# Patient Record
Sex: Female | Born: 1980 | Race: White | Hispanic: No | Marital: Married | Smoking: Never smoker
Health system: Southern US, Community
[De-identification: ages and names within clinical notes are randomized; demographics above are authoritative.]

## PROBLEM LIST (undated history)

## (undated) DIAGNOSIS — Z8669 Personal history of other diseases of the nervous system and sense organs: Secondary | ICD-10-CM

## (undated) DIAGNOSIS — Z9889 Other specified postprocedural states: Secondary | ICD-10-CM

## (undated) DIAGNOSIS — R112 Nausea with vomiting, unspecified: Secondary | ICD-10-CM

## (undated) DIAGNOSIS — Z87442 Personal history of urinary calculi: Secondary | ICD-10-CM

## (undated) DIAGNOSIS — Z8709 Personal history of other diseases of the respiratory system: Secondary | ICD-10-CM

## (undated) DIAGNOSIS — I10 Essential (primary) hypertension: Secondary | ICD-10-CM

## (undated) DIAGNOSIS — E785 Hyperlipidemia, unspecified: Secondary | ICD-10-CM

## (undated) DIAGNOSIS — R011 Cardiac murmur, unspecified: Secondary | ICD-10-CM

## (undated) HISTORY — PX: TUBAL LIGATION: SHX77

## (undated) HISTORY — PX: CHOLECYSTECTOMY: SHX55

## (undated) HISTORY — PX: KNEE ARTHROSCOPY: SUR90

## (undated) HISTORY — DX: Cardiac murmur, unspecified: R01.1

## (undated) HISTORY — PX: BACK SURGERY: SHX140

## (undated) HISTORY — PX: TONSILLECTOMY: SUR1361

## (undated) HISTORY — PX: ECTOPIC PREGNANCY SURGERY: SHX613

## (undated) HISTORY — DX: Hyperlipidemia, unspecified: E78.5

---
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
## 2000-02-05 ENCOUNTER — Other Ambulatory Visit: Admission: RE | Admit: 2000-02-05 | Discharge: 2000-02-05 | Payer: Self-pay | Admitting: Obstetrics and Gynecology

## 2000-02-16 ENCOUNTER — Emergency Department (HOSPITAL_COMMUNITY): Admission: EM | Admit: 2000-02-16 | Discharge: 2000-02-16 | Payer: Self-pay | Admitting: Emergency Medicine

## 2000-04-10 ENCOUNTER — Emergency Department (HOSPITAL_COMMUNITY): Admission: EM | Admit: 2000-04-10 | Discharge: 2000-04-10 | Payer: Self-pay | Admitting: Emergency Medicine

## 2000-05-24 ENCOUNTER — Inpatient Hospital Stay (HOSPITAL_COMMUNITY): Admission: AD | Admit: 2000-05-24 | Discharge: 2000-05-24 | Payer: Self-pay | Admitting: Obstetrics & Gynecology

## 2000-06-08 ENCOUNTER — Inpatient Hospital Stay (HOSPITAL_COMMUNITY): Admission: AD | Admit: 2000-06-08 | Discharge: 2000-06-08 | Payer: Self-pay | Admitting: Obstetrics and Gynecology

## 2000-07-25 ENCOUNTER — Inpatient Hospital Stay (HOSPITAL_COMMUNITY): Admission: AD | Admit: 2000-07-25 | Discharge: 2000-07-25 | Payer: Self-pay | Admitting: *Deleted

## 2000-08-02 ENCOUNTER — Inpatient Hospital Stay (HOSPITAL_COMMUNITY): Admission: AD | Admit: 2000-08-02 | Discharge: 2000-08-02 | Payer: Self-pay | Admitting: Obstetrics and Gynecology

## 2000-08-06 ENCOUNTER — Inpatient Hospital Stay (HOSPITAL_COMMUNITY): Admission: AD | Admit: 2000-08-06 | Discharge: 2000-08-06 | Payer: Self-pay | Admitting: Obstetrics and Gynecology

## 2000-08-13 ENCOUNTER — Inpatient Hospital Stay (HOSPITAL_COMMUNITY): Admission: AD | Admit: 2000-08-13 | Discharge: 2000-08-16 | Payer: Self-pay | Admitting: Obstetrics and Gynecology

## 2000-08-17 ENCOUNTER — Encounter: Admission: RE | Admit: 2000-08-17 | Discharge: 2000-09-16 | Payer: Self-pay | Admitting: Obstetrics and Gynecology

## 2000-08-17 ENCOUNTER — Inpatient Hospital Stay (HOSPITAL_COMMUNITY): Admission: AD | Admit: 2000-08-17 | Discharge: 2000-08-17 | Payer: Self-pay | Admitting: Obstetrics and Gynecology

## 2000-09-03 ENCOUNTER — Emergency Department (HOSPITAL_COMMUNITY): Admission: EM | Admit: 2000-09-03 | Discharge: 2000-09-03 | Payer: Self-pay | Admitting: Emergency Medicine

## 2000-09-03 ENCOUNTER — Encounter: Payer: Self-pay | Admitting: Emergency Medicine

## 2000-10-04 ENCOUNTER — Other Ambulatory Visit: Admission: RE | Admit: 2000-10-04 | Discharge: 2000-10-04 | Payer: Self-pay | Admitting: Obstetrics and Gynecology

## 2000-10-17 ENCOUNTER — Encounter: Admission: RE | Admit: 2000-10-17 | Discharge: 2000-11-16 | Payer: Self-pay | Admitting: Obstetrics and Gynecology

## 2000-10-18 ENCOUNTER — Emergency Department (HOSPITAL_COMMUNITY): Admission: EM | Admit: 2000-10-18 | Discharge: 2000-10-18 | Payer: Self-pay | Admitting: *Deleted

## 2000-11-17 ENCOUNTER — Encounter: Admission: RE | Admit: 2000-11-17 | Discharge: 2000-12-17 | Payer: Self-pay | Admitting: Obstetrics and Gynecology

## 2000-12-12 ENCOUNTER — Inpatient Hospital Stay (HOSPITAL_COMMUNITY): Admission: AD | Admit: 2000-12-12 | Discharge: 2000-12-12 | Payer: Self-pay | Admitting: *Deleted

## 2000-12-17 ENCOUNTER — Encounter (INDEPENDENT_AMBULATORY_CARE_PROVIDER_SITE_OTHER): Payer: Self-pay

## 2000-12-17 ENCOUNTER — Ambulatory Visit (HOSPITAL_COMMUNITY): Admission: RE | Admit: 2000-12-17 | Discharge: 2000-12-17 | Payer: Self-pay | Admitting: Obstetrics and Gynecology

## 2001-01-17 ENCOUNTER — Encounter: Admission: RE | Admit: 2001-01-17 | Discharge: 2001-02-16 | Payer: Self-pay | Admitting: Obstetrics and Gynecology

## 2001-03-19 ENCOUNTER — Encounter: Admission: RE | Admit: 2001-03-19 | Discharge: 2001-04-18 | Payer: Self-pay | Admitting: Obstetrics and Gynecology

## 2001-04-19 ENCOUNTER — Encounter: Admission: RE | Admit: 2001-04-19 | Discharge: 2001-05-19 | Payer: Self-pay | Admitting: Obstetrics and Gynecology

## 2001-06-17 ENCOUNTER — Encounter: Admission: RE | Admit: 2001-06-17 | Discharge: 2001-07-17 | Payer: Self-pay | Admitting: Obstetrics and Gynecology

## 2001-06-28 ENCOUNTER — Inpatient Hospital Stay (HOSPITAL_COMMUNITY): Admission: AD | Admit: 2001-06-28 | Discharge: 2001-06-28 | Payer: Self-pay | Admitting: *Deleted

## 2001-07-10 ENCOUNTER — Inpatient Hospital Stay (HOSPITAL_COMMUNITY): Admission: AD | Admit: 2001-07-10 | Discharge: 2001-07-10 | Payer: Self-pay | Admitting: Obstetrics and Gynecology

## 2001-07-26 ENCOUNTER — Inpatient Hospital Stay (HOSPITAL_COMMUNITY): Admission: AD | Admit: 2001-07-26 | Discharge: 2001-07-26 | Payer: Self-pay | Admitting: Obstetrics and Gynecology

## 2001-08-11 ENCOUNTER — Other Ambulatory Visit: Admission: RE | Admit: 2001-08-11 | Discharge: 2001-08-11 | Payer: Self-pay | Admitting: Obstetrics and Gynecology

## 2001-08-17 ENCOUNTER — Encounter: Admission: RE | Admit: 2001-08-17 | Discharge: 2001-09-16 | Payer: Self-pay | Admitting: Obstetrics and Gynecology

## 2001-09-14 ENCOUNTER — Inpatient Hospital Stay (HOSPITAL_COMMUNITY): Admission: AD | Admit: 2001-09-14 | Discharge: 2001-09-16 | Payer: Self-pay | Admitting: Obstetrics and Gynecology

## 2001-09-15 ENCOUNTER — Encounter: Payer: Self-pay | Admitting: Obstetrics and Gynecology

## 2001-10-17 ENCOUNTER — Encounter: Admission: RE | Admit: 2001-10-17 | Discharge: 2001-11-16 | Payer: Self-pay | Admitting: Obstetrics and Gynecology

## 2001-11-07 ENCOUNTER — Inpatient Hospital Stay (HOSPITAL_COMMUNITY): Admission: AD | Admit: 2001-11-07 | Discharge: 2001-11-07 | Payer: Self-pay | Admitting: Obstetrics and Gynecology

## 2001-11-17 ENCOUNTER — Encounter: Admission: RE | Admit: 2001-11-17 | Discharge: 2001-12-17 | Payer: Self-pay | Admitting: Obstetrics and Gynecology

## 2001-12-14 ENCOUNTER — Emergency Department (HOSPITAL_COMMUNITY): Admission: EM | Admit: 2001-12-14 | Discharge: 2001-12-14 | Payer: Self-pay | Admitting: *Deleted

## 2002-02-08 ENCOUNTER — Inpatient Hospital Stay (HOSPITAL_COMMUNITY): Admission: AD | Admit: 2002-02-08 | Discharge: 2002-02-08 | Payer: Self-pay | Admitting: Obstetrics and Gynecology

## 2002-02-09 ENCOUNTER — Inpatient Hospital Stay (HOSPITAL_COMMUNITY): Admission: AD | Admit: 2002-02-09 | Discharge: 2002-02-13 | Payer: Self-pay | Admitting: Obstetrics and Gynecology

## 2002-02-09 ENCOUNTER — Encounter (INDEPENDENT_AMBULATORY_CARE_PROVIDER_SITE_OTHER): Payer: Self-pay

## 2002-04-16 ENCOUNTER — Emergency Department (HOSPITAL_COMMUNITY): Admission: EM | Admit: 2002-04-16 | Discharge: 2002-04-16 | Payer: Self-pay | Admitting: Emergency Medicine

## 2002-04-18 ENCOUNTER — Other Ambulatory Visit: Admission: RE | Admit: 2002-04-18 | Discharge: 2002-04-18 | Payer: Self-pay | Admitting: Obstetrics and Gynecology

## 2002-05-11 ENCOUNTER — Emergency Department (HOSPITAL_COMMUNITY): Admission: EM | Admit: 2002-05-11 | Discharge: 2002-05-11 | Payer: Self-pay | Admitting: Emergency Medicine

## 2002-10-30 ENCOUNTER — Emergency Department (HOSPITAL_COMMUNITY): Admission: EM | Admit: 2002-10-30 | Discharge: 2002-10-31 | Payer: Self-pay | Admitting: Emergency Medicine

## 2002-11-17 ENCOUNTER — Encounter: Payer: Self-pay | Admitting: Emergency Medicine

## 2002-11-17 ENCOUNTER — Emergency Department (HOSPITAL_COMMUNITY): Admission: EM | Admit: 2002-11-17 | Discharge: 2002-11-17 | Payer: Self-pay | Admitting: Emergency Medicine

## 2003-01-11 ENCOUNTER — Emergency Department (HOSPITAL_COMMUNITY): Admission: EM | Admit: 2003-01-11 | Discharge: 2003-01-11 | Payer: Self-pay | Admitting: Emergency Medicine

## 2003-01-14 ENCOUNTER — Emergency Department (HOSPITAL_COMMUNITY): Admission: EM | Admit: 2003-01-14 | Discharge: 2003-01-14 | Payer: Self-pay | Admitting: Emergency Medicine

## 2003-04-04 ENCOUNTER — Inpatient Hospital Stay (HOSPITAL_COMMUNITY): Admission: AD | Admit: 2003-04-04 | Discharge: 2003-04-04 | Payer: Self-pay | Admitting: Obstetrics and Gynecology

## 2003-05-14 ENCOUNTER — Other Ambulatory Visit: Admission: RE | Admit: 2003-05-14 | Discharge: 2003-05-14 | Payer: Self-pay | Admitting: Obstetrics and Gynecology

## 2003-05-16 ENCOUNTER — Observation Stay (HOSPITAL_COMMUNITY): Admission: AD | Admit: 2003-05-16 | Discharge: 2003-05-17 | Payer: Self-pay | Admitting: Obstetrics and Gynecology

## 2003-08-25 ENCOUNTER — Inpatient Hospital Stay (HOSPITAL_COMMUNITY): Admission: AD | Admit: 2003-08-25 | Discharge: 2003-08-25 | Payer: Self-pay | Admitting: Obstetrics and Gynecology

## 2003-08-30 ENCOUNTER — Inpatient Hospital Stay (HOSPITAL_COMMUNITY): Admission: AD | Admit: 2003-08-30 | Discharge: 2003-08-30 | Payer: Self-pay | Admitting: Obstetrics and Gynecology

## 2003-08-31 ENCOUNTER — Inpatient Hospital Stay (HOSPITAL_COMMUNITY): Admission: AD | Admit: 2003-08-31 | Discharge: 2003-09-02 | Payer: Self-pay | Admitting: Obstetrics and Gynecology

## 2003-09-14 ENCOUNTER — Inpatient Hospital Stay (HOSPITAL_COMMUNITY): Admission: AD | Admit: 2003-09-14 | Discharge: 2003-09-14 | Payer: Self-pay | Admitting: Obstetrics and Gynecology

## 2003-09-15 ENCOUNTER — Inpatient Hospital Stay (HOSPITAL_COMMUNITY): Admission: AD | Admit: 2003-09-15 | Discharge: 2003-09-15 | Payer: Self-pay | Admitting: Obstetrics and Gynecology

## 2003-09-20 ENCOUNTER — Other Ambulatory Visit: Admission: RE | Admit: 2003-09-20 | Discharge: 2003-09-20 | Payer: Self-pay | Admitting: Obstetrics and Gynecology

## 2003-11-08 ENCOUNTER — Ambulatory Visit: Admission: RE | Admit: 2003-11-08 | Discharge: 2003-11-08 | Payer: Self-pay | Admitting: Obstetrics and Gynecology

## 2003-11-09 ENCOUNTER — Emergency Department (HOSPITAL_COMMUNITY): Admission: EM | Admit: 2003-11-09 | Discharge: 2003-11-10 | Payer: Self-pay | Admitting: Emergency Medicine

## 2003-11-15 ENCOUNTER — Inpatient Hospital Stay (HOSPITAL_COMMUNITY): Admission: RE | Admit: 2003-11-15 | Discharge: 2003-11-18 | Payer: Self-pay | Admitting: Obstetrics and Gynecology

## 2003-11-15 ENCOUNTER — Encounter (INDEPENDENT_AMBULATORY_CARE_PROVIDER_SITE_OTHER): Payer: Self-pay | Admitting: Specialist

## 2003-11-23 ENCOUNTER — Emergency Department (HOSPITAL_COMMUNITY): Admission: EM | Admit: 2003-11-23 | Discharge: 2003-11-23 | Payer: Self-pay | Admitting: Emergency Medicine

## 2003-12-09 ENCOUNTER — Emergency Department (HOSPITAL_COMMUNITY): Admission: EM | Admit: 2003-12-09 | Discharge: 2003-12-10 | Payer: Self-pay | Admitting: *Deleted

## 2004-01-18 ENCOUNTER — Other Ambulatory Visit: Admission: RE | Admit: 2004-01-18 | Discharge: 2004-01-18 | Payer: Self-pay | Admitting: Obstetrics and Gynecology

## 2004-04-12 ENCOUNTER — Emergency Department (HOSPITAL_COMMUNITY): Admission: EM | Admit: 2004-04-12 | Discharge: 2004-04-12 | Payer: Self-pay | Admitting: *Deleted

## 2004-05-15 ENCOUNTER — Emergency Department (HOSPITAL_COMMUNITY): Admission: EM | Admit: 2004-05-15 | Discharge: 2004-05-15 | Payer: Self-pay | Admitting: Emergency Medicine

## 2004-05-22 ENCOUNTER — Emergency Department: Payer: Self-pay | Admitting: Emergency Medicine

## 2004-05-22 ENCOUNTER — Emergency Department (HOSPITAL_COMMUNITY): Admission: EM | Admit: 2004-05-22 | Discharge: 2004-05-22 | Payer: Self-pay | Admitting: *Deleted

## 2004-07-14 ENCOUNTER — Emergency Department (HOSPITAL_COMMUNITY): Admission: EM | Admit: 2004-07-14 | Discharge: 2004-07-14 | Payer: Self-pay | Admitting: Emergency Medicine

## 2004-11-18 ENCOUNTER — Encounter (INDEPENDENT_AMBULATORY_CARE_PROVIDER_SITE_OTHER): Payer: Self-pay | Admitting: *Deleted

## 2004-11-18 ENCOUNTER — Ambulatory Visit (HOSPITAL_COMMUNITY): Admission: RE | Admit: 2004-11-18 | Discharge: 2004-11-18 | Payer: Self-pay | Admitting: General Surgery

## 2004-12-25 ENCOUNTER — Emergency Department (HOSPITAL_COMMUNITY): Admission: EM | Admit: 2004-12-25 | Discharge: 2004-12-25 | Payer: Self-pay | Admitting: Emergency Medicine

## 2006-01-04 ENCOUNTER — Emergency Department (HOSPITAL_COMMUNITY): Admission: EM | Admit: 2006-01-04 | Discharge: 2006-01-04 | Payer: Self-pay | Admitting: Emergency Medicine

## 2006-05-15 ENCOUNTER — Emergency Department (HOSPITAL_COMMUNITY): Admission: EM | Admit: 2006-05-15 | Discharge: 2006-05-15 | Payer: Self-pay | Admitting: Emergency Medicine

## 2006-11-11 ENCOUNTER — Emergency Department (HOSPITAL_COMMUNITY): Admission: EM | Admit: 2006-11-11 | Discharge: 2006-11-11 | Payer: Self-pay | Admitting: Emergency Medicine

## 2007-01-04 ENCOUNTER — Emergency Department (HOSPITAL_COMMUNITY): Admission: EM | Admit: 2007-01-04 | Discharge: 2007-01-04 | Payer: Self-pay | Admitting: Emergency Medicine

## 2007-01-18 ENCOUNTER — Emergency Department (HOSPITAL_COMMUNITY): Admission: EM | Admit: 2007-01-18 | Discharge: 2007-01-18 | Payer: Self-pay | Admitting: Emergency Medicine

## 2007-07-06 ENCOUNTER — Emergency Department (HOSPITAL_COMMUNITY): Admission: EM | Admit: 2007-07-06 | Discharge: 2007-07-06 | Payer: Self-pay | Admitting: Emergency Medicine

## 2007-08-04 ENCOUNTER — Emergency Department (HOSPITAL_COMMUNITY): Admission: EM | Admit: 2007-08-04 | Discharge: 2007-08-04 | Payer: Self-pay | Admitting: Emergency Medicine

## 2008-08-16 ENCOUNTER — Emergency Department (HOSPITAL_COMMUNITY): Admission: EM | Admit: 2008-08-16 | Discharge: 2008-08-16 | Payer: Self-pay | Admitting: Emergency Medicine

## 2008-09-03 ENCOUNTER — Emergency Department (HOSPITAL_COMMUNITY): Admission: EM | Admit: 2008-09-03 | Discharge: 2008-09-03 | Payer: Self-pay | Admitting: Emergency Medicine

## 2008-09-05 ENCOUNTER — Emergency Department (HOSPITAL_COMMUNITY): Admission: EM | Admit: 2008-09-05 | Discharge: 2008-09-05 | Payer: Self-pay | Admitting: Family Medicine

## 2008-09-12 ENCOUNTER — Emergency Department (HOSPITAL_COMMUNITY): Admission: EM | Admit: 2008-09-12 | Discharge: 2008-09-13 | Payer: Self-pay | Admitting: Emergency Medicine

## 2008-09-13 ENCOUNTER — Emergency Department (HOSPITAL_COMMUNITY): Admission: EM | Admit: 2008-09-13 | Discharge: 2008-09-13 | Payer: Self-pay | Admitting: Emergency Medicine

## 2008-09-14 ENCOUNTER — Emergency Department (HOSPITAL_COMMUNITY): Admission: EM | Admit: 2008-09-14 | Discharge: 2008-09-14 | Payer: Self-pay | Admitting: Emergency Medicine

## 2009-07-19 ENCOUNTER — Emergency Department (HOSPITAL_COMMUNITY): Admission: EM | Admit: 2009-07-19 | Discharge: 2009-07-20 | Payer: Self-pay | Admitting: Emergency Medicine

## 2009-08-01 ENCOUNTER — Emergency Department (HOSPITAL_COMMUNITY): Admission: EM | Admit: 2009-08-01 | Discharge: 2009-08-01 | Payer: Self-pay | Admitting: Emergency Medicine

## 2009-08-02 ENCOUNTER — Ambulatory Visit (HOSPITAL_COMMUNITY): Admission: RE | Admit: 2009-08-02 | Discharge: 2009-08-02 | Payer: Self-pay | Admitting: Chiropractic Medicine

## 2009-08-02 ENCOUNTER — Encounter: Admission: RE | Admit: 2009-08-02 | Discharge: 2009-08-02 | Payer: Self-pay | Admitting: Chiropractic Medicine

## 2009-08-17 ENCOUNTER — Ambulatory Visit (HOSPITAL_COMMUNITY): Admission: RE | Admit: 2009-08-17 | Discharge: 2009-08-17 | Payer: Self-pay | Admitting: Chiropractic Medicine

## 2009-10-28 ENCOUNTER — Encounter: Admission: RE | Admit: 2009-10-28 | Discharge: 2009-11-29 | Payer: Self-pay | Admitting: Student

## 2009-12-05 ENCOUNTER — Ambulatory Visit: Payer: Self-pay | Admitting: Sports Medicine

## 2009-12-05 DIAGNOSIS — M7500 Adhesive capsulitis of unspecified shoulder: Secondary | ICD-10-CM

## 2009-12-05 DIAGNOSIS — M25519 Pain in unspecified shoulder: Secondary | ICD-10-CM | POA: Insufficient documentation

## 2009-12-10 ENCOUNTER — Encounter: Payer: Self-pay | Admitting: Sports Medicine

## 2009-12-20 ENCOUNTER — Encounter: Payer: Self-pay | Admitting: Family Medicine

## 2010-01-07 ENCOUNTER — Emergency Department (HOSPITAL_COMMUNITY): Admission: EM | Admit: 2010-01-07 | Discharge: 2010-01-07 | Payer: Self-pay | Admitting: Family Medicine
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
## 2010-04-01 NOTE — Letter (Signed)
Summary: [facility]  [facility]   Imported By: Marily Memos 12/10/2009 13:36:30  _____________________________________________________________________  External Attachment:    Type:   Image     Comment:   External Document

## 2010-04-01 NOTE — Letter (Signed)
Summary: [facility]  [facility]   Imported By: Marily Memos 12/23/2009 08:53:33  _____________________________________________________________________  External Attachment:    Type:   Image     Comment:   External Document

## 2010-06-09 LAB — CULTURE, ROUTINE-ABSCESS

## 2010-06-09 LAB — RAPID STREP SCREEN (MED CTR MEBANE ONLY): Streptococcus, Group A Screen (Direct): NEGATIVE

## 2010-07-18 NOTE — Discharge Summary (Signed)
Barbara Summers, Barbara Summers                ACCOUNT NO.:  [id number]   MEDICAL RECORD NO.:  [id number]          PATIENT TYPE:  INP   LOCATION:  9121                          FACILITY:  [facility]   PHYSICIAN:  Michelle L. Grewal, M.D.DATE OF BIRTH:  Mar 10, 1980   DATE OF ADMISSION:  11/15/2003  DATE OF DISCHARGE:  11/18/2003                                 DISCHARGE SUMMARY   ADMISSION DIAGNOSES:  1.  Intrauterine pregnancy at 37.5 weeks estimated gestational age.  2.  Previous cesarean section, desires repeat.  3.  Desires sterilization.   DISCHARGE DIAGNOSES:  1.  Status post low transverse cesarean section.  2.  Viable female infant.  3.  Right tubal ligation.   PROCEDURE:  1.  Repeat low transverse cesarean section.  2.  Right tubal ligation.   REASON FOR ADMISSION:  Please see written H&P.   HOSPITAL COURSE:  The patient is a 30 year old, white married female,  gravida 6, para 4 that was admitted to [facility] at 37 1/2  weeks estimated gestational age for a scheduled cesarean section.  The  patient had had a previous cesarean section and was status post left  salpingectomy and desired permanent sterilization.  On the morning of  admission, the patient was taken to the operating room where spinal  anesthesia was administered without difficulty.  A low transverse incision  was made with the delivery of a viable female infant weighing 6 pounds and 9  ounces with Apgar's of 9 at 1 minute and 9 at 5 minutes. Arterial cord pH  was 7.30.  The patient tolerated the procedure well and was taken to the  recovery room in stable condition.  On postoperative day one, the patient  was without complaint, vital signs were stable, she was afebrile, abdomen  was soft, good return of bowel function, fundus was firm and nontender.  Abdominal dressing was noted to be clean, dry and intact.  Labs revealed a  hemoglobin of 9.7, platelet count of 201,000, WBC of 14.7.  On postoperative  day  two, the patient was without complaint, vital signs were stable, she was  afebrile. Fundus was firm and nontender. Abdominal dressing had been removed  revealing an incision that was clean, dry and intact.  On postoperative day  3, the patient did complain of a sore mouth, vital signs were stable. She  remained afebrile. Oral thrush was noted, incision was clean, dry and  intact. Discharge instructions were reviewed and the patient was discharged  home.   CONDITION ON DISCHARGE:  Good.   DIET:  Regular as tolerated.   ACTIVITY:  No heavy lifting, no driving x2 weeks, no vaginal entry.   FOLLOW UP:  The patient is to followup in the office in 1-2 weeks for an  incision check. She is to call for a temperature of greater than 100  degrees, persistent nausea and vomiting, heavy vaginal bleeding and/or  redness or drainage to the incisional site.   DISCHARGE MEDICATIONS:  1.  Demerol 50 mg #30, 1 p.o. q.4-6 h. p.r.n.  2.  Nystatin magic mouthwash.  The  patient is to swish four times per day.  3.  Motrin 600 mg q.6 h.  4.  Prenatal vitamins 1 p.o. daily.     Caro   CC/MEDQ  D:  12/15/2003  T:  12/15/2003  Job:  [id number]

## 2010-07-18 NOTE — Discharge Summary (Signed)
[facility]  PatientTheadora Summers                      MRN: [id number] Adm. Date:  [id number] Disc. Date: [id number] Attending:  Venita Sheffield                           Discharge Summary  HISTORY OF PRESENT ILLNESS:   The patient is an 30 year old, gravida 2, para 1-0-0-1, who was admitted with a ruptured ectopic pregnancy.  The patient was first seen on April 24, with positive pregnancy test and bleeding.  Ultrasound at that time did not show any abnormality with a thickened endometrial stripe.  The patient had no pain.  However, the patient resumed bleeding intermittently and on May 17, underwent repeat ultrasound which showed a left adnexal mass and she had a positive pregnancy test.  On examination she was very tender in the left adnexa.  The diagnostic of a ruptured ectopic pregnancy was made.  HOSPITAL COURSE:              The patient was taken to the operating room and had a leaking left ampullary ectopic pregnancy.  She had a partial left salpingectomy  performed.  Her hemoglobin was 12.4.  Postoperatively, the patient did well and was discharged home on the first postoperative day, ambulatory and on a regular diet. On Tylox one q.4h. p.r.n., Vibratabs 100 mg one b.i.d. for five days, Ortho-Tricyclen once daily.  FOLLOW-UP:                    She is to see me in three weeks.  DISCHARGE DIAGNOSIS:          Status post ruptured ectopic pregnancy. DD:  07/18/99 TD:  07/19/99 Job: [id number]

## 2010-07-18 NOTE — Op Note (Signed)
[facility]  Patient:    Barbara Summers, Barbara Summers Visit Number: [id number] MRN: [id number]          Service Type: DSU Location: [facility] Attending Physician:  Soledad Gerlach Dictated by:   Guy Sandifer Arleta Creek, M.D. Proc. Date: 12/17/00 Admit Date:  12/17/2000 Discharge Date: 12/17/2000                             Operative Report  PREOPERATIVE DIAGNOSIS:         Pelvic pain.  POSTOPERATIVE DIAGNOSES:        1. Left ovarian cyst.                                 2. Abdominopelvic adhesions.  OPERATION:                      Laparoscopy with left ovarian cystectomy and lysis of adhesions.  SURGEON:                        Guy Sandifer. Arleta Creek, M.D.  ANESTHESIA:                     General with endotracheal intubation.  ESTIMATED BLOOD LOSS:           Drops.  INDICATIONS AND CONSENT:        The patient is a 30 year old married white female G4, P2, AB2, with pelvic pain.  Details are dictated in the history and physical.  Laparoscopy with probable ovarian cystectomy and possible Obersteiner-Redlich is discussed.  Potential risks and complications have been discussed but not limited to infection, bowel, bladder and ureteral damage, bleeding requiring transfusion of blood products with a possible transfusion reaction, HIV and hepatitis acquisition, DVT, PE and pneumonia.  All questions have been answered, and consent is signed on the chart.  FINDINGS:                       Upper abdomen appears grossly normal.  The uterus is normal in size and contour.  There is some scarring on the uterovesical folds secondary to her past surgeries.  The left fallopian tube is missing although the fimbria are present.  The left ovary has approximately a 2-3 cm cyst and the ovary is otherwise smooth on its surface with no excrescences and no adhesions.  The right ovary and right fallopian tube are normal.  The anterior and posterior cul-de-sacs and pelvic sidewalls  are normal.  There is a thin line of adhesions from below the level of the umbilicus down the center of the anterior abdominal wall down to the level of the pelvis.  These are filmy adhesions and contained no bowel or large blood vessels.  DESCRIPTION OF PROCEDURE:       The patient was taken to the operating room and placed in the dorsal lithotomy position where general anesthesia is induced via endotracheal intubation. She was then placed in the dorsal lithotomy where she was prepped, bladder straight catheterized.  Hulka tenaculum was placed in the uterus as a manipulator and she was draped in a sterile fashion.  Small infraumbilical incision was made and a 10-11 disposable trocar sleeve was placed on the first attempt without difficulty. Placement was verified with the laparoscope, and no damage to the surrounding  structures was noted.  The above findings were noted.  The adhesions were taken down sharply at the point of insertion in the anterior abdominal wall. Good hemostasis is maintained.  A small suprapubic incision is then made in the midline above the level of the previous Pfannenstiel scar.  A 5 mm nondisposable trocar was then placed under direct visualization without difficulty.  The left ovary was grasped and attempts at aspiration with the needle were unsuccessful in getting any fluid back.  Therefore, the capsule of the ovary is incised with scissors, and a cystectomy is performed removing the entire ovarian capsule.  Bipolar cautery was used to obtain hemostasis primarily at the edges of the ovarian capsule.  Irrigation was carried out, and again good hemostasis was noted all around.  Interceed was backloaded through the laparoscope and placed around the left ovary slightly moistened. Excess fluid was removed.  The suprapubic trocar sleeve was removed. Pneumoperitoneum was reduced and hemostasis was noted again.  The laparoscope was then removed along the umbilical  trocar sleeve.  The skin incision is closed with a 3-0 Vicryl suture in the subcutaneous layers of the umbilical incision with care being taken not to pick up any underlying structures.  The skin incisions were then closed with Dermabond.  Skin incisions were then injected with 0.50% plain Marcaine.  Hulka tenaculum is removed.  All counts were correct.  The patient is awakened and taken to the recovery room in stable condition. Dictated by:   Guy Sandifer Arleta Creek, M.D. Attending Physician:  Soledad Gerlach DD:  12/17/00 TD:  12/19/00 Job: [id number]

## 2010-07-18 NOTE — Op Note (Signed)
[facility]  Patient:    Barbara Summers, Barbara Summers                      MRN: [id number] Proc. Date: 08/13/00 Adm. Date:  [id number] Disc. Date: [id number] Attending:  Trevor Iha                           Operative Report  PREOPERATIVE DIAGNOSES:       1. Intrauterine pregnancy at 38-1/2 weeks                                  estimated gestational age.                               2. Previous cesarean section, desires repeat.  POSTOPERATIVE DIAGNOSES:      1. Intrauterine pregnancy at 38-1/2 weeks                                  estimated gestational age.                               2. Previous cesarean section, desires repeat.                               3. Homero Fellers breech presentation.  PROCEDURE:                    Repeat low transverse cesarean section.  SURGEON:                      Guy Sandifer. Arleta Creek, M.D.  ASSISTANTFreddy Finner, M.D.  ANESTHESIA:                   Spinal.  ANESTHESIOLOGIST:             Ellison Hughs., M.D.  ESTIMATED BLOOD LOSS:         500 cc.  FINDINGS:                     Viable female infant.  Apgars 8 and 9 at 1 and five minutes respectively.  Birth weight 8 lb 0 oz.  Arterial cord pH 7.23.  INDICATIONS AND CONSENT:      The patient is a 30 year old married white female, G4, P1, AB2 with an EDC of August 24, 2000.  The patient has had a previous cesarean section and desires repeat.  The procedure has been discussed.  The potential complications have been discussed including but not limited to infection; bowel, bladder or ureteral damage; bleeding requiring transfusion of blood products with possible transfusion reaction, HIV and hepatitis acquisition; DVT; PE and pneumonia.  All questions were answered and consent was signed on the chart.  DESCRIPTION OF PROCEDURE:     The patient was taken to the operating room, where a spinal anesthetic was placed.  She was then placed in the  dorsal supine position with a 15 degree left lateral wedge.  She was prepped abdominal.  A Foley catheter was placed and the bladder was drained.  SHe was draped in a sterile fashion.  After testing for adequate spinal anesthesia, the old scar was removed on the way in.  Dissection was carried out in layers to the peritoneum.  There was a lot of subcutaneous scarring.  The peritoneum was incised and extended superiorly and inferiorly.  The vesicouterine peritoneum was taken down cephalolaterally.  A bladder flap was developed and a bladder blade was placed.  The uterus was incised in a low transverse manner and the uterine cavity was entered bluntly with a Kelly clamp.  The uterine incision was then extended cephalolaterally with the fingers.  Artificial rupture of membranes was carried out with clear fluid.  The frank breech was then delivered without difficulty.  Good cry and tone was noted.  The cord was clamped and cut and the infant handed to the awaiting pediatrics team.  The placenta was manually delivered.  The uterine cavity was cleaned.  The uterine cavity was heart-shaped.  The uterus was then closed in a running locking manner with 0 Monocryl suture.  Good hemostasis was attained.  The tubes and ovaries were normal bilaterally.  The anterior peritoneum was closed in running fashion with  0 Monocryl suture, which was also used to reapproximate the pyramidalis muscle in the midline.  The anterior rectus fascia was closed in running fashion with 0 PDS suture.  The subcutaneous scarification from her previous scar was taken down with the cautery and a subcutaneous layer of 0 plain suture was placed in an interrupted fashion.  The skin was sthen closed with clips.  All sponge, needle and instrument counts were correct.  The patient was taken to the recovery room in stable condition. DD:  08/13/00 TD:  08/13/00 Job: [id number]

## 2010-07-18 NOTE — Discharge Summary (Signed)
   NAME:  Barbara Summers, Barbara Summers                          ACCOUNT NO.:  [id number]   MEDICAL RECORD NO.:  [id number]                   PATIENT TYPE:  INP   LOCATION:  9323                                 FACILITY:  [facility]   PHYSICIAN:  Dineen Kid. Rana Snare, M.D.                 DATE OF BIRTH:  [birth year]   DATE OF ADMISSION:  09/14/2001  DATE OF DISCHARGE:  09/16/2001                                 DISCHARGE SUMMARY   ADMITTING DIAGNOSES:  1. Intrauterine pregnancy at 65 and 2/7 weeks estimated gestational age.  2. Flank pain.  3. History of renal calculi.  4. Probable kidney stone.   DISCHARGE DIAGNOSES:  1. Intrauterine pregnancy at 75 and 4/7 weeks estimated gestational age.  2. Resolution of nephrolithiasis.   REASON FOR ADMISSION:  Please see dictated H&P.   HOSPITAL COURSE:  The patient was admitted to the hospital for management of  right flank pain.  The patient had history of renal calculi.  The patient  denied vaginal bleeding.  IV antibiotics were administered and morphine was  given per PCA pump for pain management.  Urine was sent for urinalysis and  C&S.  Renal ultrasound was performed revealing no hydronephrosis.  Summers small 3-  4 mm stone was noted in the left kidney.  On hospital day two abdomen was  soft.  The patient continued to complain of right CVA tenderness.  PCA pump  was discontinued and patient was taking oral pain medication with good  relief.  On hospital day three patient was improving.  The patient stated  that she passed Summers kidney stone.  CVA tenderness is resolved.  Urine culture  was negative and patient was discharged home.   CONDITION ON DISCHARGE:  Good.   DIET:  Regular, as tolerated.   ACTIVITY:  As tolerated.   FOLLOW UP:  The patient is to follow up in the office in one week.  She is  to call for temperature greater than 100 degrees, urinary retention,  dysuria, or hematuria.   DISCHARGE MEDICATIONS:  1. Demerol 50 mg number 30 one q.4-6h. p.r.n.  pain.  2. Keflex 500 mg one p.o. t.i.d.  3. Prenatal vitamins one p.o. daily.     Judith Blonder, N.P.                     Dineen Kid Rana Snare, M.D.    CGC/MEDQ  D:  10/19/2001  T:  10/19/2001  Job:  [id number]

## 2010-07-18 NOTE — Discharge Summary (Signed)
[facility]  Patient:    Barbara Summers, Barbara Summers                        MRN: [id number] Adm. Date:  08/13/00 Disc. Date: 08/16/00 Dictator:   Danie Chandler, R.N.                           Discharge Summary  ADMITTING DIAGNOSES:            1. Intrauterine pregnancy at 38-1/[redacted] weeks                                    gestation.                                 2. Previous cesarean section, desires repeat.  DISCHARGE DIAGNOSES:            1. Intrauterine pregnancy at 38-1/[redacted] weeks                                    gestation.                                 2. Previous cesarean section, desires repeat.                                 3. Homero Fellers breech presentation.  PROCEDURE:                      On August 13, 2000, repeat low transverse cesarean section.  REASON FOR ADMISSION:           The patient is a 30 year old married white female gravida 4, para 1 with an estimated date confinement of August 24, 2000. The patient has had a previous cesarean section and desires repeat.  HOSPITAL COURSE:                The patient was taken to the operating room and underwent the above named procedure without complications.  This was productive of a viable female with Apgars of 8 at one minute and 9 at five minutes, and an arterial cord pH of 7.23.  Postoperatively, on day #1, the patients hemoglobin was 8.7, hematocrit 26.0 and white blood cell count 12.5. The patient had a good return of bowel function on this day and was without complaint on postoperative day #2.  The patients hemoglobin remained stable at 8.7.  She was tolerating a regular diet, ambulating well without difficulty.  She also had good pain control.  DISCHARGE CONDITION:            Good.  DIET:                           Regular as tolerated.  ACTIVITY:                       No heavy lifting, no driving, no vaginal entry.  She is to follow up in the office in 1-2 weeks for incision check, and she  is to  call for temperature greater than 100 degrees, persistent nausea, vomiting, heavy vaginal bleeding and/or redness and drainage from the incision site.  DISCHARGE MEDICATIONS:          1. Prenatal vitamins one p.o. q.d.                                 2. Percocet #50 as directed by M.D.                                 3. Motrin #30 as directed by M.D.                                 4. Ambien #10 as directed by M.D. DD:  09/03/00 TD:  09/03/00 Job: [id number]

## 2010-07-18 NOTE — Op Note (Signed)
NAME:  Barbara Summers, Barbara Summers                          ACCOUNT NO.:  [id number]   MEDICAL RECORD NO.:  [id number]                   PATIENT TYPE:  MAT   LOCATION:  [facility]                                 FACILITY:  [facility]   PHYSICIAN:  Guy Sandifer. Arleta Creek, M.D.           DATE OF BIRTH:  [birth year]   DATE OF PROCEDURE:  02/09/2002  DATE OF DISCHARGE:                                 OPERATIVE REPORT   PREOPERATIVE DIAGNOSES:  1. Intrauterine pregnancy at 36-1/2 weeks estimated gestational age.  2. Labor.  3. Previous cesarean section x2, desires repeat.   POSTOPERATIVE DIAGNOSES:  1. Intrauterine pregnancy at 36-1/2 weeks estimated gestational age.  2. Labor.  3. Previous cesarean section x2, desires repeat.   PROCEDURE:  Repeat low transverse cesarean section.   SURGEON:  Guy Sandifer. Henderson Cloud, M.D.   ANESTHESIA:  Spinal, Dr. Maisie Fus.   ESTIMATED BLOOD LOSS:  800 cc.   SPECIMENS:  Placenta.   FINDINGS:  Viable female infant, Apgars of 8 and 9 at one and five minutes,  respectively.  Birth weight 6 pounds 4 ounces.  Arterial cord pH 7.14.   INDICATIONS AND CONSENT:  This patient is a 30 year old married white female  G5, P2 with an EDC of March 06, 2001.  She has had uterine contractions for  greater than 24 hours.  They have been consistent and firm.  Cervix is  fingertip dilated.  After discussion of the options, decision is made to  proceed with cesarean section.  Potential risks and complications have been  discussed.  Potential for prematurity has also been discussed.  All  questions are answered and consent is signed on the chart.   PROCEDURE:  The patient is taken to the operating room where a spinal  anesthetic is placed.  She is placed in the dorsal supine position with a 15  degree left lateral wedge where she is prepped abdominally and a Foley  catheter is placed in a sterile fashion.  She is then draped in a sterile  fashion.  After testing for adequate spinal anesthesia, skin  is entered  through a Pfannenstiel incision taking out the old scar on the way in.  The  subcutaneous scarring is quite dense.  Dissection is carried down to the  peritoneum which is incised and extended superiorly and inferiorly.  The  vesicouterine peritoneum is taken down cephalolaterally.  The bladder flap  is developed and the bladder blade is placed.  Uterus is incised in a low  transverse manner.  The uterine cavity is entered bluntly with a Kelly  clamp.  The uterine incision is then extended cephalolaterally with the  fingers.  Artificial rupture of membranes is then carried out for clear  fluid.  The vertex is delivered and nuchal cord x1 is reduced.  The oro and  nasopharynx are thoroughly suctioned.  Remainder of the infant is then  delivered.  Cord is clamped and  cut and the infant is handed to the waiting  pediatrics team.  Placenta is manually delivered and sent to pathology.  Uterus is exteriorized and the uterus is closed in a running locking fashion  with 0 Monocryl suture.  The second running embrocating layer of 0 Monocryl  is also added.  Good hemostasis is obtained.  There is some mild filmy  adhesions around the left fallopian tube.  Ovaries are normal.  Uterus is  returned to the abdomen and good hemostasis is again noted after irrigation.  Anterior peritoneum is closed in a running fashion with a 0 Monocryl suture  which is also used to reapproximate the pyramidalis muscle in the midline.  Anterior rectus fascia is closed in a running fashion with 0 PDS suture and  the skin is closed with clips.  All sponge, instrument, needle counts are  correct and the patient is transferred to the recovery room in stable  condition.                                               Guy Sandifer Arleta Creek, M.D.    JET/MEDQ  D:  02/09/2002  T:  02/09/2002  Job:  [id number]

## 2010-07-18 NOTE — Op Note (Signed)
NAMEANTHONETTE, Barbara Summers                ACCOUNT NO.:  [id number]   MEDICAL RECORD NO.:  [id number]          PATIENT TYPE:  AMB   LOCATION:  SDS                          FACILITY:  [facility]   PHYSICIAN:  Gabrielle Dare. Janee Morn, M.D.DATE OF BIRTH:  06/23/1980   DATE OF PROCEDURE:  11/18/2004  DATE OF DISCHARGE:  11/18/2004                                 OPERATIVE REPORT   PREOPERATIVE DIAGNOSIS:  Anal skin tag.   POSTOPERATIVE DIAGNOSIS:  Anal skin tag.   PROCEDURE:  Excision of anal skin tag.   SURGEON:  Violeta Gelinas, MD   ANESTHESIA:  MAC.   HISTORY OF PRESENT ILLNESS:  The patient is a 30 year old female with a  history of an anal skin tag.  I evaluated her in the office when it was  becoming more symptomatic with itching and trouble with hygiene, and she  presents for elective excision.   PROCEDURE IN DETAIL:  Informed consent was obtained.  The patient received  intravenous antibiotics.  Latex allergy protocols were followed.  She was  identified and brought to the operating room.  MAC anesthesia was  administered.  She was placed in lithotomy.  Her perianal region was prepped  and draped in a sterile fashion.  0.5% Marcaine with epinephrine was  injected around her skin tag; the skin tag was 1 cm in size.  Next, digital  rectal exam was done, revealing no rectal masses.  She had some minimal  internal hemorrhoids in the posterior region.  The small anal retractor was  then inserted.  A 3-0 chromic stitch was placed at the proximal base and  tied securely.  The skin tag was then excised with an elliptical incision  and sent to Pathology.  Bovie cautery was used to get good hemostasis of the  wound base and then the mucosa was then reapproximated by running the 3-0  chromic suture; this was tied securely.  Some pressure was held for a minute  or two, achieving excellent hemostasis.  The area was irrigated and a  sterile dressing was applied.  Sponge, needle and instrument counts were  correct.  The patient tolerated the procedure well without apparent  complication and was taken the recovery room in stable condition.      Gabrielle Dare Janee Morn, M.D.  Electronically Signed     BET/MEDQ  D:  11/18/2004  T:  11/19/2004  Job:  [id number]

## 2010-07-18 NOTE — H&P (Signed)
Barbara Summers, Barbara Summers                           ACCOUNT NO.:  [id number]   MEDICAL RECORD NO.:  [id number]                   PATIENT TYPE:   LOCATION:                                       FACILITY:   PHYSICIAN:  Guy Sandifer. Arleta Creek, M.D.           DATE OF BIRTH:   DATE OF ADMISSION:  08/31/2003  DATE OF DISCHARGE:                                HISTORY & PHYSICAL   CHIEF COMPLAINT:  Preterm labor.   HISTORY OF PRESENT ILLNESS:  This patient is a 30 year old married white  female, G6, P3 with prenatal care complicated by three previous cesarean  sections and one preterm delivery.  This pregnancy has been marked by  increased uterine contractions.  Her EDC is December 01, 2003, placing her at  31 weeks estimated gestational age.  She has been taking oral terbutaline  but is no longer getting relief from that.  Only subcutaneous terbutaline  helps.  Cervix has remained closed and thick.  However, she has a positive  fetal fibrin __________ screen.  After consideration of the above, she is  being admitted for a 24-hour magnesium sulfate washout and then if approved,  transition to a subcutaneous terbutaline pump.  She denies rupture of  membranes, vaginal bleeding.   PAST MEDICAL HISTORY:  See prenatal history and physical.   PAST SURGICAL HISTORY:  See prenatal history and physical.   FAMILY HISTORY:  See prenatal history and physical.   OBSTETRIC HISTORY:  See prenatal history and physical.   MEDICATIONS:  1. Terbutaline 2.5 mg p.o. q.4-6h. p.r.n.  2. Prenatal vitamins.   ALLERGIES:  1. LATEX.  2. ZITHROMAX.  3. FLAGYL.  4. PERCOCET.   REVIEW OF SYSTEMS:  Contractions as above.  NEUROLOGIC:  Denies headache.  CARDIAC: Denies chest pain.  PULMONARY:  Denies shortness of breath.   PHYSICAL EXAMINATION:  VITAL SIGNS:  Height 5 feet 5 inches.  Weight 185  pounds.  Blood pressure 124/70.  HEENT:  Without thyromegaly.  LUNGS:  Clear to auscultation.  HEART:  Regular rate and  rhythm.  BREASTS:  Not examined.  ABDOMEN:  Gravid, nontender.  PELVIC:  Cervix closed, thick and high.  Size equals dates.  EXTREMITIES:  Grossly within normal limits  NEUROLOGIC:  Grossly within normal limits.   ASSESSMENT:  Preterm contractions with positive fetal fibrin __________ and  history of preterm delivery.   PLAN:  Magnesium sulfate.                                               Guy Sandifer Arleta Creek, M.D.    JET/MEDQ  D:  08/31/2003  T:  08/31/2003  Job:  [id number]

## 2010-07-18 NOTE — H&P (Signed)
[facility]  Patient:    Barbara Summers, Barbara Summers Visit Number: [id number] MRN: [id number]          Service Type: OBS Location: [id number] Attending Physician:  Soledad Gerlach Dictated by:   Guy Sandifer Arleta Creek, M.D. Admit Date:  09/14/2001                           History and Physical  REASON FOR ADMISSION:         Probable kidney stone.  HISTORY OF PRESENT ILLNESS:   This patient is a 30 year old married white female, G5, P2, AB2 who is at 15-2/[redacted] weeks gestational age consistent with a 5-1/7 weeks ultrasound.  She has a history of kidney stones.  She was seen in the office on September 12, 2001, where she was given Zofran for her nausea and vomiting of pregnancy.  She had the onset of right flank pain this morning. She has been in bed with the pain all day.  No vomiting.  She has felt cold today but no shaking chills.  No gross hematuria.  No vaginal bleeding.  On evaluation in the office, her urinalysis revealed 1+ leukocytes but no red blood cells.  She has tenderness over the right kidney as well as the course of the right ureter.  She is being admitted for further management.  PAST MEDICAL HISTORY:         1. Heart murmur.                               2. Asthma with no medications for the past                                  three years.                               3. History of kidney stones.  PAST SURGICAL HISTORY:        1. Tubes in her ears at 18 months old.                               2. Ectopic pregnancy 2001.                               3. Therapeutic abortion 1997.                               4. Knee surgery in 1998.                               5. Laparoscopic ovarian cystectomy 2002.  OBSTETRIC HISTORY:            Cesarean section x2 and termination x1. Ectopic pregnancy x1.  FAMILY HISTORY:               Positive for heart attack in father, paternal grandfather, paternal grandmother and paternal aunt.  Heart disease  in maternal grandmother.  Chronic hypertension in father and maternal uncle. Diabetes in maternal grandmother and maternal uncle.  Thyroid  dysfunction in cousin.  Kidney stones in father.  Unknown type of cancer maternal grandmother.  Stroke in father, paternal grandfather, paternal grandmother, maternal grandfather.  Migraine headaches in maternal aunt.  Mental illness in mother.  Drug addiction in mother.  Downs syndrome in patients cousin.  MEDICATIONS:                  1. Prenatal vitamins.                               2. Zofran 8 mg p.o. b.i.d. p.r.n.  ALLERGIES:                    Zithromax and Flagyl.  REVIEW OF SYSTEMS:            Negative except as above.  PHYSICAL EXAMINATION: VITAL SIGNS:                  Height 5 feet 5 inches, weight 156-1/2 pounds, blood pressure 90/60.  HEENT:                        Without thyromegaly.  LUNGS:                        Clear to auscultation.  HEART:                        Regular rate and rhythm.  BREASTS:                      Not examined.  BACK:                         Mild right CVA tenderness and tenderness along the course of the right ureter.  Mild right lower quadrant tenderness without rebound.  Abdomen is otherwise soft without masses.  EXTREMITIES:                  Grossly within normal limits.  NEUROLOGICAL:                 Grossly within normal limits.  PELVIC:                       Cervix closed, thick and high.  Size equals dates.  Fetal heart tones are auscultated.  ASSESSMENT:                   Probable nephrolithiasis on the right.  PLAN:                         Observation bed with IV fluids.  Antiemetics, pain medication, antibiotics, strain urine. Dictated by:   Guy Sandifer Arleta Creek, M.D. Attending Physician:  Soledad Gerlach DD:  09/14/01 TD:  09/15/01 Job: [id number]

## 2010-07-18 NOTE — Discharge Summary (Signed)
NAMENESSIE, NONG A                          ACCOUNT NO.:  [id number]   MEDICAL RECORD NO.:  [id number]                   PATIENT TYPE:  INP   LOCATION:  9153                                 FACILITY:  [facility]   PHYSICIAN:  Guy Sandifer. Arleta Creek, M.D.           DATE OF BIRTH:  26-May-1980   DATE OF ADMISSION:  09/01/2003  DATE OF DISCHARGE:  09/02/2003                                 DISCHARGE SUMMARY   ADMITTING DIAGNOSES:  1. Intrauterine pregnancy at approximately 29 weeks estimated gestational     age.  2. Preterm contractions.  3. History of preterm delivery.  4. Positive fetal fibronectin.   DISCHARGE DIAGNOSES:  1. Intrauterine pregnancy at approximately 29 weeks estimated gestational     age.  2. Preterm contractions.  3. History of preterm delivery.  4. Positive fetal fibronectin.   REASON FOR ADMISSION:  This patient is a 30 year old married white female  with recurrent preterm contractions refractory to oral terbutaline.  She has  a positive fetal fibronectin and history of a preterm delivery.  Cervix is  closed.  She is admitted for magnesium sulfate washout.   HOSPITAL COURSE:  The patient is admitted to the hospital, IV is placed, and  magnesium sulfate is administered.  Contractions stop.  After approximately  24 hours, she is transitioned to oral terbutaline.  She is doing well on  this.  She will be discharged home this evening.   CONDITION ON DISCHARGE:  Good.   DIET:  Regular as tolerated.   ACTIVITY:  No vaginal entry.  No lifting.  She will be on modified bed rest  at home.   MEDICATIONS:  1. Terbutaline 2.5 mg (#50) one p.o. q.4h. (two refills).  Instructions on     taking medication are given.  2. Prenatal vitamins daily.   FOLLOW UP:  Followup is in the office this week.  Fetal movement counts are  also emphasized.                                               Guy Sandifer Arleta Creek, M.D.    JET/MEDQ  D:  09/02/2003  T:  09/02/2003  Job:  [id number]

## 2010-07-18 NOTE — Op Note (Signed)
Barbara Summers, Barbara Summers                          ACCOUNT NO.:  [id number]   MEDICAL RECORD NO.:  [id number]                   PATIENT TYPE:  INP   LOCATION:  9198                                 FACILITY:  [facility]   PHYSICIAN:  Guy Sandifer. Arleta Creek, M.D.           DATE OF BIRTH:  [birth year]   DATE OF PROCEDURE:  11/15/2003  DATE OF DISCHARGE:                                 OPERATIVE REPORT   PREOPERATIVE DIAGNOSES:  1.  Previous cesarean section.  2.  Desires permanent sterilization.   POSTOPERATIVE DIAGNOSES:  1.  Previous cesarean section.  2.  Desires permanent sterilization.   OPERATION PERFORMED:  1.  Repeat low transverse cesarean section.  2.  Right tubal ligation.   SURGEON:  Guy Sandifer. Henderson Cloud, M.D.   ANESTHESIA:  Spinal.   ANESTHESIOLOGIST:  Belva Agee, M.D.   ESTIMATED BLOOD LOSS:  800 mL.   FINDINGS:  Viable female infant, Apgars of 9 and 9 at one and five minutes  respectively.  Birth weight 6 pounds and 9 ounces.  Arterial cord pH 7.30.   SPECIMENS:  Section of right fallopian tube.   INDICATIONS FOR PROCEDURE:  The patient is a 30 year old married white  female, G6, P4 at 37-1/2 weeks estimated gestational age with previous  cesarean section.  She desires repeat.  Desires permanent sterilization.  Due to maternal exhaustion, amniocentesis last week was carried out.  L:S  ratio was approximately 2.3:1 with negative PG.  Diabetes screen was  negative.  The cesarean section was then delayed one week and she presents  today for surgery.  Potential risks and complications have been discussed  with the patient preoperatively including but not limited to infection,  bowel, bladder or ureteral damage, bleeding requiring transfusion of blood  products, possible transfusion reaction, HIV and hepatitis acquisition, DVT,  PE and pneumonia.  Tubal ligation, alternative methods of contraception,  permanence of the procedure, failure rate and increased ectopic risks have  also been reviewed with the patient preoperatively.  She is status post left  salpingectomy.   DESCRIPTION OF PROCEDURE:  The patient was taken to the operating room where  she was identified.  Spinal anesthetic was placed and she was placed in  dorsal supine position with a 15 degree left lateral wedge.  She was then  prepped.  Foley catheter was placed in the bladder as a drain and she was  draped in the sterile fashion.  After testing for adequate spinal  anesthesia, a Pfannenstiel incision was made resecting the scar on the way  in.  Dissection was carried out in layers to the peritoneum which was  incised and extended superiorly and inferiorly.  Vesicouterine peritoneum  was taken down cephalolaterally.  The bladder flap was developed.  The  bladder blade was placed.  The uterus was incised in low transverse manner.  The uterine cavity was entered bluntly with a hemostat.  The uterine  incision was extended cephalolaterally with the fingers.  Artificial rupture  of membranes with clear fluid was carried out.  Vertex delivered.  Oronaropharynx was suctioned and the remainder of the baby was delivered.  Good cry and tone was noted.  Cord was clamped and cut.  The baby was handed  to the waiting pediatrics team.  Placenta was manually delivered.  The  uterine cavity was cleaned.  The uterus was closed in a running locking  layer of 0 Monocryl suture.  Hemostasis was obtained with two figure-of-  eight sutures on the right one third of the incision.  Careful inspection  revealed an absence of the left fallopian tube.  The right fallopian tube  was identified from cornu to fimbria, grasped in its midampullary portion  with a Babcock clamp.  A knuckle of fallopian tube was then doubly ligated  with two free ties of 0 plain suture.  The intervening knuckle was then  resected.  Cautery was used to assure complete hemostasis.  Ovaries were  normal bilaterally.  Irrigation was carried out.  The  anterior peritoneum  was then closed in running fashion with 0 Monocryl suture which was also  used to reapproximate the pyramidalis muscle in the midline.  The anterior  rectus fascia was closed in a running fashion with 0 PDS suture and the skin  was closed with clips.  All sponge, needle and instrument counts were  correct and the patient was transferred to the recovery room in stable  condition.                                               Guy Sandifer Arleta Creek, M.D.    JET/MEDQ  D:  11/15/2003  T:  11/15/2003  Job:  [id number]
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
## 2010-07-18 NOTE — Discharge Summary (Signed)
Barbara Summers, STINGER                          ACCOUNT NO.:  [id number]   MEDICAL RECORD NO.:  [id number]                   PATIENT TYPE:  INP   LOCATION:  9120                                 FACILITY:  [facility]   PHYSICIAN:  Freddy Finner, M.D.                DATE OF BIRTH:  1980-05-27   DATE OF ADMISSION:  02/09/2002  DATE OF DISCHARGE:  02/13/2002                                 DISCHARGE SUMMARY   ADMITTING DIAGNOSES:  1. Intrauterine pregnancy at 36-1/2 weeks estimated gestational age.  2. Previous cesarean section x2, desires repeat.  3. Labor.   DISCHARGE DIAGNOSES:  1. Status post low transverse cesarean section.  2. Viable female infant.   PROCEDURE:  Repeat low transverse cesarean section.   REASON FOR ADMISSION:  Please see dictated H&P.   HOSPITAL COURSE:  The patient is a 30 year old white married female gravida  5, para 2 that was admitted to [facility] in labor at 44-  1/2 estimated gestational age.  The patient was contracting regularly for  greater than 24 hours.  Cervix was noted to be a fingertip dilated.  Fetal  heart tones were reactive.  Diagnosis of labor was made.  Repeat cesarean  was discussed with patient.  Decision was made to proceed with a cesarean  delivery.  The patient was taken to the operating room where spinal  anesthesia was administered without difficulty.  A low transverse incision  was made with the delivery of a viable female infant weighing 6 pounds 4  ounces.  Apgars of 8 at one minute, 9 at five minutes.  Arterial cord pH was  7.14.  The patient tolerated procedure well and was taken to the recovery  room in stable condition.  On postoperative day one patient had good return  of bowel sounds.  Abdomen was soft.  Abdominal dressing was clean, dry, and  intact.  The patient did complain of incisional pain.  PCA pump had been  discontinued due to IV infiltration.  The patient was unable to tolerate  Percocet.  Pain  medication was changed to Demerol orally.  Laboratories  revealed hemoglobin of 10.0, platelet count of 193,000, WBC count of 12.4.  On postoperative day two patient had good relief of pain from oral pain  medications.  Abdomen was soft.  Abdominal dressing was removed revealing an  incision that was clean, dry, and intact.  The patient was ambulating well  without assistance.  She was tolerating a regular diet without complaints of  nausea and vomiting.  On postoperative day three patient's baby was stable  in the NICU.  The patient was doing well.  Abdomen was soft.  Incision was  clean, dry, and intact.  On postoperative day four patient complained of  incisional soreness.  Slight ecchymosis was noted at the inferior margin  right of the incision.  Fundus was firm and nontender.  Staples removed and  patient was discharged home.  Preceding discharge flu vaccine was  administered.   CONDITION ON DISCHARGE:  Good.   DIET:  Regular as tolerated.   ACTIVITY:  No heavy lifting.  No vaginal entry.  No driving x2 weeks.   FOLLOW UP:  The patient is to follow up in the office in one to two weeks  for an incision check.  She is to call for temperature greater than 100  degrees, persistent nausea and vomiting, heavy vaginal bleeding, and/or  redness or drainage from the incisional site.   DISCHARGE MEDICATIONS:  1. Vicodin 5/500 number 30 one p.o. q.4-6h. p.r.n. pain.  2. Prenatal vitamins one p.o. daily.  3. Colace one p.o. daily p.r.n.     Julio Sicks, N.P.                        Freddy Finner, M.D.    CC/MEDQ  D:  03/16/2002  T:  03/17/2002  Job:  [id number]

## 2011-11-01 ENCOUNTER — Encounter (HOSPITAL_COMMUNITY): Payer: Self-pay | Admitting: *Deleted

## 2011-11-01 ENCOUNTER — Emergency Department (INDEPENDENT_AMBULATORY_CARE_PROVIDER_SITE_OTHER)
Admission: EM | Admit: 2011-11-01 | Discharge: 2011-11-01 | Disposition: A | Discharge status: Home / Self Care | Payer: Self-pay | Source: Home / Self Care | Attending: Family Medicine | Admitting: Family Medicine

## 2011-11-01 DIAGNOSIS — Z041 Encounter for examination and observation following transport accident: Secondary | ICD-10-CM

## 2011-11-01 DIAGNOSIS — M549 Dorsalgia, unspecified: Secondary | ICD-10-CM

## 2011-11-01 MED ORDER — CYCLOBENZAPRINE HCL 5 MG PO TABS: 5.0000 mg | ORAL_TABLET | Freq: Three times a day (TID) | ORAL | Status: AC | PRN

## 2011-11-01 MED ORDER — DICLOFENAC POTASSIUM 50 MG PO TABS: 50.0000 mg | ORAL_TABLET | Freq: Three times a day (TID) | ORAL | Status: AC
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
## 2012-01-12 ENCOUNTER — Encounter (HOSPITAL_COMMUNITY): Payer: Self-pay | Admitting: *Deleted

## 2012-01-12 ENCOUNTER — Emergency Department (HOSPITAL_COMMUNITY)
Admission: EM | Admit: 2012-01-12 | Discharge: 2012-01-12 | Disposition: A | Discharge status: Home / Self Care | Payer: 59 | Attending: Emergency Medicine | Admitting: Emergency Medicine

## 2012-01-12 DIAGNOSIS — K089 Disorder of teeth and supporting structures, unspecified: Secondary | ICD-10-CM | POA: Insufficient documentation

## 2012-01-12 DIAGNOSIS — K0889 Other specified disorders of teeth and supporting structures: Secondary | ICD-10-CM

## 2012-01-12 DIAGNOSIS — J45909 Unspecified asthma, uncomplicated: Secondary | ICD-10-CM | POA: Insufficient documentation

## 2012-01-12 DIAGNOSIS — K047 Periapical abscess without sinus: Secondary | ICD-10-CM

## 2012-01-12 MED ORDER — PENICILLIN V POTASSIUM 500 MG PO TABS: 500.0000 mg | ORAL_TABLET | Freq: Three times a day (TID) | ORAL | Status: DC

## 2012-01-12 MED ORDER — HYDROCODONE-ACETAMINOPHEN 5-325 MG PO TABS: 1.0000 | ORAL_TABLET | ORAL | Status: DC | PRN

## 2012-01-12 NOTE — ED Provider Notes (Signed)
History     CSN: [id number]  Arrival date & time 01/12/12  0122   First MD Initiated Contact with Patient 01/12/12 0133      Chief Complaint  Patient presents with  . Dental abscess    HPI  History provided by the patient. Patient is a 31 year old female with history of asthma who presents with complaints of right lower dental pain and abscess the tooth. Patient reports having a long history of decay of right lower first molar with occasions of the tooth breaking off. She has had some waxing waning pain as a result of this tooth. Tonight she presents with complaints of significant increased pain over the past 3 days. She also reports having swelling of the lateral adjacent come with some occasional purulent drainage. She reports having occasional subjective fever and chills at home but has not taken her temperature. She has been using ibuprofen for pain with some relief. She denies any other complaints or symptoms. Denies any swelling of the time, difficulty swallowing or difficulty breathing.   Past Medical History  Diagnosis Date  . Asthma   . Tubal pregnancy     Past Surgical History  Procedure Date  . Cesarean section   . Knee arthroscopy   . Ectopic pregnancy surgery     No family history on file.  History  Substance Use Topics  . Smoking status: Never Smoker   . Smokeless tobacco: Not on file  . Alcohol Use: Yes    OB History    Grav Para Term Preterm Abortions TAB SAB Ect Mult Living                  Review of Systems  Constitutional: Negative for fever, chills, diaphoresis and appetite change.  HENT: Positive for dental problem. Negative for sore throat, drooling, neck pain and neck stiffness.   Respiratory: Negative for shortness of breath.   Gastrointestinal: Negative for nausea and vomiting.    Allergies  Flagyl; Hydromorphone hcl; Latex; Oxycodone-acetaminophen; and Zithromax  Home Medications   Current Outpatient Rx  Name  Route  Sig  Dispense   Refill  . DICLOFENAC POTASSIUM 50 MG PO TABS   Oral   Take 1 tablet (50 mg total) by mouth 3 (three) times daily.   30 tablet   0     BP 105/57  Pulse 62  Temp 97.8 F (36.6 C) (Oral)  Resp 20  SpO2 100%  LMP 01/09/2012  Physical Exam  Nursing note and vitals reviewed. Constitutional: She is oriented to person, place, and time. She appears well-developed and well-nourished. No distress.  HENT:  Head: Normocephalic.  Mouth/Throat:         Significant dental decay of the right lower first molar to the gumline. There is swelling to the adjacent come with purulent drainage. There is no large area of fluctuance or drainable abscess.  No swelling under the tongue or signs concerning for Ludwig angina.  Neck: Normal range of motion. Neck supple.  Cardiovascular: Normal rate and regular rhythm.   Pulmonary/Chest: Effort normal and breath sounds normal. No respiratory distress. She has no wheezes. She exhibits no tenderness.  Abdominal: Soft.  Lymphadenopathy:    She has no cervical adenopathy.  Neurological: She is alert and oriented to person, place, and time.  Skin: Skin is warm and dry.  Psychiatric: She has a normal mood and affect. Her behavior is normal.    ED Course  Procedures      1. Periapical abscess  2. Pain, dental       MDM  1:50 AM patient seen and evaluated. Patient appears comfortable in no acute distress or significant discomfort.        Angus Seller, [place] 01/12/12 [phone]

## 2012-01-12 NOTE — ED Provider Notes (Signed)
Medical screening examination/treatment/procedure(s) were performed by non-physician practitioner and as supervising physician I was immediately available for consultation/collaboration.   Bolton Canupp L Sherin Murdoch, MD 01/12/12 0415 

## 2012-01-12 NOTE — ED Notes (Signed)
Pt c/o bottom right tooth ache x 3 days

## 2012-08-02 ENCOUNTER — Encounter (HOSPITAL_BASED_OUTPATIENT_CLINIC_OR_DEPARTMENT_OTHER): Payer: Self-pay

## 2012-08-02 ENCOUNTER — Emergency Department (HOSPITAL_BASED_OUTPATIENT_CLINIC_OR_DEPARTMENT_OTHER)
Admission: EM | Admit: 2012-08-02 | Discharge: 2012-08-02 | Disposition: A | Discharge status: Home / Self Care | Payer: 59 | Attending: Emergency Medicine | Admitting: Emergency Medicine

## 2012-08-02 DIAGNOSIS — Z91040 Latex allergy status: Secondary | ICD-10-CM | POA: Insufficient documentation

## 2012-08-02 DIAGNOSIS — L237 Allergic contact dermatitis due to plants, except food: Secondary | ICD-10-CM

## 2012-08-02 DIAGNOSIS — L255 Unspecified contact dermatitis due to plants, except food: Secondary | ICD-10-CM | POA: Insufficient documentation

## 2012-08-02 DIAGNOSIS — Z79899 Other long term (current) drug therapy: Secondary | ICD-10-CM | POA: Insufficient documentation

## 2012-08-02 DIAGNOSIS — J45909 Unspecified asthma, uncomplicated: Secondary | ICD-10-CM | POA: Insufficient documentation

## 2012-08-02 DIAGNOSIS — R21 Rash and other nonspecific skin eruption: Secondary | ICD-10-CM | POA: Insufficient documentation

## 2012-08-02 MED ORDER — PREDNISONE 10 MG PO TABS: ORAL_TABLET | ORAL | Status: DC

## 2012-08-02 NOTE — ED Notes (Signed)
PA at bedside.

## 2012-08-02 NOTE — ED Provider Notes (Signed)
History     CSN: [id number]  Arrival date & time 08/02/12  1425   First MD Initiated Contact with Patient 08/02/12 1518      Chief Complaint  Patient presents with  . Poison Ivy    (Consider location/radiation/quality/duration/timing/severity/associated sxs/prior treatment) Patient is a 32 y.o. female presenting with poison ivy. The history is provided by the patient. No language interpreter was used.  Poison Lajoyce Corners This is a new problem. The current episode started yesterday. The problem occurs constantly. The problem has been gradually worsening. Associated symptoms include a rash. Nothing aggravates the symptoms. She has tried nothing for the symptoms. The treatment provided moderate relief.    Past Medical History  Diagnosis Date  . Asthma   . Tubal pregnancy     Past Surgical History  Procedure Laterality Date  . Cesarean section    . Knee arthroscopy    . Ectopic pregnancy surgery    . Tubal ligation      No family history on file.  History  Substance Use Topics  . Smoking status: Never Smoker   . Smokeless tobacco: Not on file  . Alcohol Use: Yes    OB History   Grav Para Term Preterm Abortions TAB SAB Ect Mult Living                  Review of Systems  Skin: Positive for rash.  All other systems reviewed and are negative.    Allergies  Flagyl; Hydromorphone hcl; Latex; Oxycodone-acetaminophen; and Zithromax  Home Medications   Current Outpatient Rx  Name  Route  Sig  Dispense  Refill  . diclofenac (CATAFLAM) 50 MG tablet   Oral   Take 1 tablet (50 mg total) by mouth 3 (three) times daily.   30 tablet   0   . HYDROcodone-acetaminophen (NORCO) 5-325 MG per tablet   Oral   Take 1-2 tablets by mouth every 4 (four) hours as needed for pain.   20 tablet   0   . penicillin v potassium (VEETID) 500 MG tablet   Oral   Take 1 tablet (500 mg total) by mouth 3 (three) times daily.   30 tablet   0   . predniSONE (DELTASONE) 10 MG tablet     6,6,5,5,4,4,3,3,2,1   39 tablet   0     BP 129/65  Pulse 96  Temp(Src) 99 F (37.2 C) (Oral)  Resp 16  Ht 5\' 7"  (1.702 m)  Wt 136 lb (61.689 kg)  BMI 21.3 kg/m2  SpO2 100%  LMP 07/22/2012  Physical Exam  Nursing note and vitals reviewed. Constitutional: She appears well-developed and well-nourished.  HENT:  Head: Normocephalic and atraumatic.  Eyes: Pupils are equal, round, and reactive to light.  Neck: Normal range of motion.  Cardiovascular: Normal rate.   Pulmonary/Chest: Effort normal.  Musculoskeletal: Normal range of motion.  Neurological: She is alert.  Skin: Rash noted.  Linear raised rash,    Psychiatric: She has a normal mood and affect.    ED Course  Procedures (including critical care time)  Labs Reviewed - No data to display No results found.   1. Poison ivy       MDM  Prednisone taper  Elson Areas, PA-C 08/02/12 1556

## 2012-08-02 NOTE — ED Notes (Signed)
C/o rash to face, hands, legs-daughter dx with poison ivy yesterday

## 2012-08-03 NOTE — ED Provider Notes (Signed)
Medical screening examination/treatment/procedure(s) were performed by non-physician practitioner and as supervising physician I was immediately available for consultation/collaboration.  Geoffery Lyons, MD 08/03/12 [phone]

## 2014-05-08 ENCOUNTER — Encounter (HOSPITAL_BASED_OUTPATIENT_CLINIC_OR_DEPARTMENT_OTHER): Payer: Self-pay | Admitting: Emergency Medicine

## 2014-05-08 ENCOUNTER — Emergency Department (HOSPITAL_BASED_OUTPATIENT_CLINIC_OR_DEPARTMENT_OTHER)
Admission: EM | Admit: 2014-05-08 | Discharge: 2014-05-08 | Disposition: A | Discharge status: Home / Self Care | Payer: 59 | Attending: Emergency Medicine | Admitting: Emergency Medicine

## 2014-05-08 DIAGNOSIS — Z792 Long term (current) use of antibiotics: Secondary | ICD-10-CM | POA: Insufficient documentation

## 2014-05-08 DIAGNOSIS — Z91040 Latex allergy status: Secondary | ICD-10-CM | POA: Diagnosis not present

## 2014-05-08 DIAGNOSIS — R05 Cough: Secondary | ICD-10-CM | POA: Diagnosis present

## 2014-05-08 DIAGNOSIS — J45909 Unspecified asthma, uncomplicated: Secondary | ICD-10-CM | POA: Diagnosis not present

## 2014-05-08 DIAGNOSIS — B349 Viral infection, unspecified: Secondary | ICD-10-CM

## 2014-05-08 DIAGNOSIS — J40 Bronchitis, not specified as acute or chronic: Secondary | ICD-10-CM

## 2014-05-08 MED ORDER — BENZONATATE 100 MG PO CAPS: 100.0000 mg | ORAL_CAPSULE | Freq: Three times a day (TID) | ORAL | Status: DC | PRN

## 2014-05-08 MED ORDER — DOXYCYCLINE HYCLATE 100 MG PO TABS
100.0000 mg | ORAL_TABLET | Freq: Once | ORAL | Status: AC
  Administered 2014-05-08: 100 mg via ORAL
  Filled 2014-05-08: qty 1

## 2014-05-08 MED ORDER — ONDANSETRON 8 MG PO TBDP
8.0000 mg | ORAL_TABLET | Freq: Once | ORAL | Status: AC
  Administered 2014-05-08: 8 mg via ORAL
  Filled 2014-05-08: qty 1

## 2014-05-08 MED ORDER — DOXYCYCLINE HYCLATE 100 MG PO CAPS: 100.0000 mg | ORAL_CAPSULE | Freq: Two times a day (BID) | ORAL | Status: DC

## 2014-05-08 MED ORDER — ONDANSETRON 8 MG PO TBDP: 8.0000 mg | ORAL_TABLET | Freq: Three times a day (TID) | ORAL | Status: DC | PRN

## 2014-05-08 NOTE — ED Notes (Signed)
Pt states that she has had n/v/d/ sore throat/ congestion for 1 week.

## 2014-05-08 NOTE — ED Provider Notes (Signed)
CSN: [id number]     Arrival date & time 05/08/14  0019 History   First MD Initiated Contact with Patient 05/08/14 0236     Chief Complaint  Patient presents with  . Flu-Like Symptoms      (Consider location/radiation/quality/duration/timing/severity/associated sxs/prior Treatment) HPI  This is a 34 year old female with a one-week history of flulike symptoms. A week ago she started with nausea, vomiting and diarrhea. The symptoms have improved although she does have some persistent diarrhea. Over the past week she has developed a worsening productive cough, sore throat, nasal congestion and general malaise. Her principal complaint at the present time is sore throat and chest soreness, worse when she coughs or takes a deep breath. Her cough has become productive of green sputum and she had a fever as high as 101 yesterday morning. She has had inadequate relief with over-the-counter medications.  Past Medical History  Diagnosis Date  . Asthma   . Tubal pregnancy    Past Surgical History  Procedure Laterality Date  . Cesarean section    . Knee arthroscopy    . Ectopic pregnancy surgery    . Tubal ligation     History reviewed. No pertinent family history. History  Substance Use Topics  . Smoking status: Never Smoker   . Smokeless tobacco: Not on file  . Alcohol Use: Yes   OB History    No data available     Review of Systems  All other systems reviewed and are negative.   Allergies  Flagyl; Hydromorphone hcl; Latex; Oxycodone-acetaminophen; and Zithromax  Home Medications   Prior to Admission medications   Medication Sig Start Date End Date Taking? Authorizing Provider  HYDROcodone-acetaminophen (NORCO) 5-325 MG per tablet Take 1-2 tablets by mouth every 4 (four) hours as needed for pain. 01/12/12   Ivonne AndrewPeter Dammen, PA-C  penicillin v potassium (VEETID) 500 MG tablet Take 1 tablet (500 mg total) by mouth 3 (three) times daily. 01/12/12   Ivonne AndrewPeter Dammen, PA-C  predniSONE  (DELTASONE) 10 MG tablet 6,6,5,5,4,4,3,3,2,1 08/02/12   Elson AreasLeslie K Sofia, PA-C   BP 135/69 mmHg  Pulse 70  Temp(Src) 98.4 F (36.9 C) (Oral)  Resp 18  Ht 5\' 7"  (1.702 m)  Wt 183 lb (83.008 kg)  BMI 28.66 kg/m2  SpO2 100%  LMP 04/20/2014   Physical Exam  General: Well-developed, well-nourished female in no acute distress; appearance consistent with age of record HENT: normocephalic; atraumatic; pharynx normal; no dysphonia Eyes: pupils equal, round and reactive to light; extraocular muscles intact Neck: supple Heart: regular rate and rhythm; Lungs: clear to auscultation bilaterally Abdomen: soft; nondistended; nontender; no masses or hepatosplenomegaly; bowel sounds present Extremities: No deformity; full range of motion; pulses normal Neurologic: Awake, alert and oriented; motor function intact in all extremities and symmetric; no facial droop Skin: Warm and dry Psychiatric: Normal mood and affect    ED Course  Procedures (including critical care time)   MDM  Symptoms are concerning for a bacterial bronchitis secondary to viral illness. We will treat accordingly.  Paula LibraJohn Rosealie Reach, MD 05/08/14 [phone]

## 2014-11-15 ENCOUNTER — Ambulatory Visit (HOSPITAL_COMMUNITY): Payer: 59 | Admitting: Anesthesiology

## 2014-11-15 ENCOUNTER — Encounter (HOSPITAL_COMMUNITY)
Admission: RE | Disposition: A | Discharge status: Home / Self Care | Payer: Self-pay | Source: Ambulatory Visit | Attending: Orthopedic Surgery

## 2014-11-15 ENCOUNTER — Other Ambulatory Visit: Payer: Self-pay | Admitting: Orthopedic Surgery

## 2014-11-15 ENCOUNTER — Ambulatory Visit (HOSPITAL_BASED_OUTPATIENT_CLINIC_OR_DEPARTMENT_OTHER)
Admission: RE | Admit: 2014-11-15 | Discharge: 2014-11-15 | Disposition: A | Discharge status: Home / Self Care | Payer: 59 | Source: Ambulatory Visit | Attending: Orthopedic Surgery | Admitting: Orthopedic Surgery

## 2014-11-15 ENCOUNTER — Encounter (HOSPITAL_COMMUNITY): Payer: Self-pay | Admitting: *Deleted

## 2014-11-15 DIAGNOSIS — S63642A Sprain of metacarpophalangeal joint of left thumb, initial encounter: Secondary | ICD-10-CM | POA: Diagnosis not present

## 2014-11-15 DIAGNOSIS — Z79891 Long term (current) use of opiate analgesic: Secondary | ICD-10-CM | POA: Insufficient documentation

## 2014-11-15 DIAGNOSIS — X58XXXA Exposure to other specified factors, initial encounter: Secondary | ICD-10-CM | POA: Diagnosis not present

## 2014-11-15 DIAGNOSIS — S63682A Other sprain of left thumb, initial encounter: Secondary | ICD-10-CM | POA: Diagnosis present

## 2014-11-15 DIAGNOSIS — M1990 Unspecified osteoarthritis, unspecified site: Secondary | ICD-10-CM | POA: Diagnosis not present

## 2014-11-15 HISTORY — DX: Nausea with vomiting, unspecified: R11.2

## 2014-11-15 HISTORY — PX: ULNAR COLLATERAL LIGAMENT REPAIR: SHX6159

## 2014-11-15 HISTORY — DX: Personal history of other diseases of the nervous system and sense organs: Z86.69

## 2014-11-15 HISTORY — DX: Personal history of other diseases of the respiratory system: Z87.09

## 2014-11-15 HISTORY — DX: Other specified postprocedural states: Z98.890

## 2014-11-15 LAB — BASIC METABOLIC PANEL
ANION GAP: 7 (ref 5–15)
BUN: 8 mg/dL (ref 6–20)
CALCIUM: 9.1 mg/dL (ref 8.9–10.3)
CO2: 24 mmol/L (ref 22–32)
CREATININE: 0.57 mg/dL (ref 0.44–1.00)
Chloride: 108 mmol/L (ref 101–111)
Glucose, Bld: 79 mg/dL (ref 65–99)
Potassium: 3.8 mmol/L (ref 3.5–5.1)
SODIUM: 139 mmol/L (ref 135–145)

## 2014-11-15 LAB — CBC
HCT: 40.4 % (ref 36.0–46.0)
Hemoglobin: 13.5 g/dL (ref 12.0–15.0)
MCH: 30.4 pg (ref 26.0–34.0)
MCHC: 33.4 g/dL (ref 30.0–36.0)
MCV: 91 fL (ref 78.0–100.0)
PLATELETS: 261 10*3/uL (ref 150–400)
RBC: 4.44 MIL/uL (ref 3.87–5.11)
RDW: 13 % (ref 11.5–15.5)
WBC: 7.3 10*3/uL (ref 4.0–10.5)

## 2014-11-15 LAB — SURGICAL PCR SCREEN
MRSA, PCR: NEGATIVE
Staphylococcus aureus: NEGATIVE

## 2014-11-15 SURGERY — REPAIR, LIGAMENT, ULNAR COLLATERAL
Anesthesia: General | Site: Hand | Laterality: Left

## 2014-11-15 MED ORDER — PROPOFOL 10 MG/ML IV BOLUS
INTRAVENOUS | Status: DC | PRN
  Administered 2014-11-15: 200 mg via INTRAVENOUS

## 2014-11-15 MED ORDER — METOCLOPRAMIDE HCL 5 MG/ML IJ SOLN
INTRAMUSCULAR | Status: DC | PRN
  Administered 2014-11-15: 5 mg via INTRAVENOUS

## 2014-11-15 MED ORDER — MIDAZOLAM HCL 2 MG/2ML IJ SOLN
INTRAMUSCULAR | Status: AC
  Filled 2014-11-15: qty 4

## 2014-11-15 MED ORDER — DEXAMETHASONE SODIUM PHOSPHATE 10 MG/ML IJ SOLN
INTRAMUSCULAR | Status: AC
  Filled 2014-11-15: qty 1

## 2014-11-15 MED ORDER — SCOPOLAMINE 1 MG/3DAYS TD PT72
1.0000 | MEDICATED_PATCH | TRANSDERMAL | Status: DC
  Administered 2014-11-15: 1.5 mg via TRANSDERMAL
  Filled 2014-11-15: qty 1

## 2014-11-15 MED ORDER — FENTANYL CITRATE (PF) 100 MCG/2ML IJ SOLN
INTRAMUSCULAR | Status: AC
  Filled 2014-11-15: qty 2

## 2014-11-15 MED ORDER — FENTANYL CITRATE (PF) 250 MCG/5ML IJ SOLN
INTRAMUSCULAR | Status: AC
  Filled 2014-11-15: qty 5

## 2014-11-15 MED ORDER — FENTANYL CITRATE (PF) 100 MCG/2ML IJ SOLN
50.0000 ug | Freq: Once | INTRAMUSCULAR | Status: AC
  Administered 2014-11-15: 50 ug via INTRAVENOUS
  Filled 2014-11-15: qty 1

## 2014-11-15 MED ORDER — LACTATED RINGERS IV SOLN
INTRAVENOUS | Status: DC | PRN
  Administered 2014-11-15: 14:00:00 via INTRAVENOUS

## 2014-11-15 MED ORDER — FENTANYL CITRATE (PF) 100 MCG/2ML IJ SOLN
25.0000 ug | INTRAMUSCULAR | Status: DC | PRN
  Administered 2014-11-15 (×3): 50 ug via INTRAVENOUS

## 2014-11-15 MED ORDER — BUPIVACAINE HCL (PF) 0.25 % IJ SOLN
INTRAMUSCULAR | Status: DC | PRN
  Administered 2014-11-15: 7 mL

## 2014-11-15 MED ORDER — BUPIVACAINE HCL (PF) 0.25 % IJ SOLN
INTRAMUSCULAR | Status: AC
  Filled 2014-11-15: qty 30

## 2014-11-15 MED ORDER — FENTANYL CITRATE (PF) 100 MCG/2ML IJ SOLN
INTRAMUSCULAR | Status: AC
  Administered 2014-11-15: 50 ug via INTRAVENOUS
  Filled 2014-11-15: qty 2

## 2014-11-15 MED ORDER — MEPERIDINE HCL 25 MG/ML IJ SOLN: 6.2500 mg | INTRAMUSCULAR | Status: DC | PRN

## 2014-11-15 MED ORDER — PROMETHAZINE HCL 25 MG/ML IJ SOLN: 6.2500 mg | INTRAMUSCULAR | Status: DC | PRN

## 2014-11-15 MED ORDER — LIDOCAINE HCL (CARDIAC) 20 MG/ML IV SOLN
INTRAVENOUS | Status: DC | PRN
  Administered 2014-11-15: 100 mg via INTRAVENOUS

## 2014-11-15 MED ORDER — LACTATED RINGERS IV SOLN
INTRAVENOUS | Status: DC
  Administered 2014-11-15: 13:00:00 via INTRAVENOUS

## 2014-11-15 MED ORDER — MUPIROCIN 2 % EX OINT
TOPICAL_OINTMENT | CUTANEOUS | Status: AC
  Filled 2014-11-15: qty 22

## 2014-11-15 MED ORDER — MIDAZOLAM HCL 5 MG/5ML IJ SOLN
INTRAMUSCULAR | Status: DC | PRN
  Administered 2014-11-15: 2 mg via INTRAVENOUS

## 2014-11-15 MED ORDER — FENTANYL CITRATE (PF) 100 MCG/2ML IJ SOLN
INTRAMUSCULAR | Status: DC | PRN
  Administered 2014-11-15: 50 ug via INTRAVENOUS
  Administered 2014-11-15: 100 ug via INTRAVENOUS
  Administered 2014-11-15 (×2): 50 ug via INTRAVENOUS

## 2014-11-15 MED ORDER — DOCUSATE SODIUM 100 MG PO CAPS: 100.0000 mg | ORAL_CAPSULE | Freq: Two times a day (BID) | ORAL | Status: DC

## 2014-11-15 MED ORDER — SCOPOLAMINE 1 MG/3DAYS TD PT72
MEDICATED_PATCH | TRANSDERMAL | Status: AC
  Filled 2014-11-15: qty 1

## 2014-11-15 MED ORDER — ONDANSETRON HCL 4 MG/2ML IJ SOLN
INTRAMUSCULAR | Status: DC | PRN
  Administered 2014-11-15: 4 mg via INTRAVENOUS

## 2014-11-15 MED ORDER — HYDROCODONE-ACETAMINOPHEN 5-300 MG PO TABS: 1.0000 | ORAL_TABLET | Freq: Four times a day (QID) | ORAL | Status: DC | PRN

## 2014-11-15 MED ORDER — METOCLOPRAMIDE HCL 5 MG/ML IJ SOLN
INTRAMUSCULAR | Status: AC
  Filled 2014-11-15: qty 2

## 2014-11-15 MED ORDER — CHLORHEXIDINE GLUCONATE 4 % EX LIQD: 60.0000 mL | Freq: Once | CUTANEOUS | Status: DC

## 2014-11-15 MED ORDER — DEXAMETHASONE SODIUM PHOSPHATE 10 MG/ML IJ SOLN
INTRAMUSCULAR | Status: DC | PRN
  Administered 2014-11-15: 10 mg via INTRAVENOUS

## 2014-11-15 MED ORDER — CEFAZOLIN SODIUM-DEXTROSE 2-3 GM-% IV SOLR
INTRAVENOUS | Status: AC
  Filled 2014-11-15: qty 50

## 2014-11-15 MED ORDER — VITAMIN C 500 MG PO TABS: 500.0000 mg | ORAL_TABLET | Freq: Every day | ORAL | Status: DC

## 2014-11-15 MED ORDER — CEFAZOLIN SODIUM-DEXTROSE 2-3 GM-% IV SOLR
2.0000 g | INTRAVENOUS | Status: AC
  Administered 2014-11-15: 2 g via INTRAVENOUS

## 2014-11-15 MED ORDER — MUPIROCIN 2 % EX OINT
1.0000 "application " | TOPICAL_OINTMENT | Freq: Once | CUTANEOUS | Status: AC
  Administered 2014-11-15: 1 via TOPICAL

## 2014-11-15 SURGICAL SUPPLY — 53 items
ANCH SUT 2-0 3/8 CRC MIC FT 4 (Anchor) ×1 IMPLANT
ANCHOR FT CORKSCREW MICRO 2-0 (Anchor) ×1 IMPLANT
BANDAGE ELASTIC 3 VELCRO ST LF (GAUZE/BANDAGES/DRESSINGS) ×1 IMPLANT
BNDG CMPR 9X4 STRL LF SNTH (GAUZE/BANDAGES/DRESSINGS) ×1
BNDG CMPR MD 5X2 ELC HKLP STRL (GAUZE/BANDAGES/DRESSINGS) ×1
BNDG COHESIVE 1X5 TAN STRL LF (GAUZE/BANDAGES/DRESSINGS) IMPLANT
BNDG CONFORM 2 STRL LF (GAUZE/BANDAGES/DRESSINGS) ×1 IMPLANT
BNDG ELASTIC 2 VLCR STRL LF (GAUZE/BANDAGES/DRESSINGS) ×1 IMPLANT
BNDG ESMARK 4X9 LF (GAUZE/BANDAGES/DRESSINGS) ×2 IMPLANT
BNDG GAUZE ELAST 4 BULKY (GAUZE/BANDAGES/DRESSINGS) IMPLANT
CORDS BIPOLAR (ELECTRODE) ×2 IMPLANT
COVER SURGICAL LIGHT HANDLE (MISCELLANEOUS) ×2 IMPLANT
CUFF TOURNIQUET SINGLE 18IN (TOURNIQUET CUFF) ×2 IMPLANT
CUFF TOURNIQUET SINGLE 24IN (TOURNIQUET CUFF) IMPLANT
DRAPE OEC MINIVIEW 54X84 (DRAPES) IMPLANT
DRSG ADAPTIC 3X8 NADH LF (GAUZE/BANDAGES/DRESSINGS) IMPLANT
DRSG EMULSION OIL 3X3 NADH (GAUZE/BANDAGES/DRESSINGS) ×1 IMPLANT
GAUZE SPONGE 2X2 8PLY STRL LF (GAUZE/BANDAGES/DRESSINGS) IMPLANT
GAUZE SPONGE 4X4 12PLY STRL (GAUZE/BANDAGES/DRESSINGS) IMPLANT
GLOVE BIOGEL PI IND STRL 8.5 (GLOVE) ×1 IMPLANT
GLOVE BIOGEL PI INDICATOR 8.5 (GLOVE) ×1
GLOVE SURG ORTHO 8.0 STRL STRW (GLOVE) ×2 IMPLANT
GOWN STRL REUS W/ TWL LRG LVL3 (GOWN DISPOSABLE) ×3 IMPLANT
GOWN STRL REUS W/TWL LRG LVL3 (GOWN DISPOSABLE) ×6
KIT BASIN OR (CUSTOM PROCEDURE TRAY) ×2 IMPLANT
KIT ROOM TURNOVER OR (KITS) ×2 IMPLANT
MANIFOLD NEPTUNE II (INSTRUMENTS) ×2 IMPLANT
NEEDLE 27GAX1X1/2 (NEEDLE) IMPLANT
NS IRRIG 1000ML POUR BTL (IV SOLUTION) ×2 IMPLANT
PACK ORTHO EXTREMITY (CUSTOM PROCEDURE TRAY) ×2 IMPLANT
PAD ARMBOARD 7.5X6 YLW CONV (MISCELLANEOUS) ×4 IMPLANT
PAD CAST 3X4 CTTN HI CHSV (CAST SUPPLIES) IMPLANT
PADDING CAST COTTON 3X4 STRL (CAST SUPPLIES) ×2
SOAP 2 % CHG 4 OZ (WOUND CARE) ×2 IMPLANT
SPECIMEN JAR SMALL (MISCELLANEOUS) ×2 IMPLANT
SPONGE GAUZE 2X2 STER 10/PKG (GAUZE/BANDAGES/DRESSINGS)
SPONGE GAUZE 4X4 12PLY STER LF (GAUZE/BANDAGES/DRESSINGS) ×1 IMPLANT
STRIP CLOSURE SKIN 1/2X4 (GAUZE/BANDAGES/DRESSINGS) IMPLANT
SUCTION FRAZIER TIP 10 FR DISP (SUCTIONS) IMPLANT
SUT ETHIBOND 3-0 V-5 (SUTURE) IMPLANT
SUT ETHILON 5 0 PS 2 18 (SUTURE) IMPLANT
SUT MERSILENE 4 0 P 3 (SUTURE) IMPLANT
SUT PROLENE 3 0 PS 2 (SUTURE) IMPLANT
SUT SILK 4 0 PS 2 (SUTURE) IMPLANT
SUT VIC AB 3-0 FS2 27 (SUTURE) IMPLANT
SUT VIC AB 4-0 P-3 18X BRD (SUTURE) IMPLANT
SUT VIC AB 4-0 P3 18 (SUTURE)
SYR CONTROL 10ML LL (SYRINGE) IMPLANT
TOWEL OR 17X24 6PK STRL BLUE (TOWEL DISPOSABLE) ×2 IMPLANT
TOWEL OR 17X26 10 PK STRL BLUE (TOWEL DISPOSABLE) ×2 IMPLANT
TUBE CONNECTING 12X1/4 (SUCTIONS) IMPLANT
UNDERPAD 30X30 INCONTINENT (UNDERPADS AND DIAPERS) ×2 IMPLANT
WATER STERILE IRR 1000ML POUR (IV SOLUTION) ×2 IMPLANT

## 2014-11-15 NOTE — Discharge Instructions (Signed)
KEEP BANDAGE CLEAN AND DRY CALL OFFICE FOR F/U APPT [phone] IN 14 DAYS DR Melvyn Novas CELL [phone] KEEP HAND ELEVATED ABOVE HEART OK TO APPLY ICE TO OPERATIVE AREA CONTACT OFFICE IF ANY WORSENING PAIN OR CONCERNS.

## 2014-11-15 NOTE — Anesthesia Preprocedure Evaluation (Signed)
Anesthesia Evaluation  Patient identified by MRN, date of birth, ID band Patient awake    Reviewed: Allergy & Precautions, NPO status , Patient's Chart, lab work & pertinent test results  History of Anesthesia Complications (+) PONV and history of anesthetic complications  Airway Mallampati: II  TM Distance: >3 FB Neck ROM: Full    Dental no notable dental hx.    Pulmonary neg pulmonary ROS,    Pulmonary exam normal breath sounds clear to auscultation       Cardiovascular negative cardio ROS Normal cardiovascular exam Rhythm:Regular Rate:Normal     Neuro/Psych negative neurological ROS  negative psych ROS   GI/Hepatic negative GI ROS, Neg liver ROS,   Endo/Other  negative endocrine ROS  Renal/GU negative Renal ROS     Musculoskeletal  (+) Arthritis ,   Abdominal   Peds  Hematology negative hematology ROS (+)   Anesthesia Other Findings   Reproductive/Obstetrics negative OB ROS                             Anesthesia Physical Anesthesia Plan  ASA: I  Anesthesia Plan: General   Post-op Pain Management:    Induction: Intravenous  Airway Management Planned: LMA  Additional Equipment: None  Intra-op Plan:   Post-operative Plan: Extubation in OR  Informed Consent: I have reviewed the patients History and Physical, chart, labs and discussed the procedure including the risks, benefits and alternatives for the proposed anesthesia with the patient or authorized representative who has indicated his/her understanding and acceptance.   Dental advisory given  Plan Discussed with: CRNA  Anesthesia Plan Comments:         Anesthesia Quick Evaluation

## 2014-11-15 NOTE — Progress Notes (Signed)
Fentanyl wasted w/ Norva Riffle. Unable to pull up pt's profile in pyxis

## 2014-11-15 NOTE — Transfer of Care (Signed)
Immediate Anesthesia Transfer of Care Note  Patient: Barbara Summers  Procedure(s) Performed: Procedure(s): LEFT THUMB ULNAR COLLATERAL LIGAMENT REPAIR AND OR RECONTSTRUCTION (Left)  Patient Location: PACU  Anesthesia Type:General  Level of Consciousness: awake, alert , oriented and patient cooperative  Airway & Oxygen Therapy: Patient Spontanous Breathing  Post-op Assessment: Report given to RN, Post -op Vital signs reviewed and stable and Patient moving all extremities  Post vital signs: Reviewed and stable  Last Vitals:  Filed Vitals:   11/15/14 1122  BP:   Pulse:   Temp: 36.9 C  Resp:     Complications: No apparent anesthesia complications

## 2014-11-15 NOTE — H&P (Signed)
Barbara Summers is an 34 y.o. female.   Chief Complaint: Left thumb injury HPI: [place] Sustained closed injury to left thumb Pt here for surgery No prior surgery to left thumb  Past Medical History  Diagnosis Date  . History of bronchitis a yr ago  . History of migraine last one a month ago  . PONV (postoperative nausea and vomiting)     Past Surgical History  Procedure Laterality Date  . Cesarean section    . Knee arthroscopy    . Ectopic pregnancy surgery    . Tubal ligation      History reviewed. No pertinent family history. Social History:  reports that she has never smoked. She does not have any smokeless tobacco history on file. She reports that she drinks alcohol. She reports that she does not use illicit drugs.  Allergies:  Allergies  Allergen Reactions  . Hydromorphone Hcl Nausea Only and Other (See Comments)    Hallucinations  . Latex Anaphylaxis, Shortness Of Breath and Rash  . Vancomycin Hives and Other (See Comments)    Blisters  . Flagyl [Metronidazole] Other (See Comments)    Unknown - Childhood allergy  . Oxycodone-Acetaminophen Nausea Only  . Zithromax [Azithromycin] Nausea Only    Medications Prior to Admission  Medication Sig Dispense Refill  . acetaminophen (TYLENOL) 500 MG tablet Take 1,000 mg by mouth every 6 (six) hours as needed for moderate pain.    Marland Kitchen HYDROcodone-acetaminophen (NORCO/VICODIN) 5-325 MG per tablet Take 2 tablets by mouth every 4 (four) hours as needed for moderate pain.    . benzonatate (TESSALON) 100 MG capsule Take 1 capsule (100 mg total) by mouth 3 (three) times daily as needed for cough. (Patient not taking: Reported on 11/15/2014) 21 capsule 0  . doxycycline (VIBRAMYCIN) 100 MG capsule Take 1 capsule (100 mg total) by mouth 2 (two) times daily. One po bid x 7 days (Patient not taking: Reported on 11/15/2014) 14 capsule 0  . ondansetron (ZOFRAN ODT) 8 MG disintegrating tablet Take 1 tablet (8 mg total) by  mouth every 8 (eight) hours as needed for nausea or vomiting. (Patient not taking: Reported on 11/15/2014) 10 tablet 0    Results for orders placed or performed during the hospital encounter of 11/15/14 (from the past 48 hour(s))  CBC     Status: None   Collection Time: 11/15/14 12:22 PM  Result Value Ref Range   WBC 7.3 4.0 - 10.5 K/uL   RBC 4.44 3.87 - 5.11 MIL/uL   Hemoglobin 13.5 12.0 - 15.0 g/dL   HCT 40.4 36.0 - 46.0 %   MCV 91.0 78.0 - 100.0 fL   MCH 30.4 26.0 - 34.0 pg   MCHC 33.4 30.0 - 36.0 g/dL   RDW 13.0 11.5 - 15.5 %   Platelets 261 150 - 400 K/uL  Basic metabolic panel     Status: None   Collection Time: 11/15/14 12:22 PM  Result Value Ref Range   Sodium 139 135 - 145 mmol/L   Potassium 3.8 3.5 - 5.1 mmol/L   Chloride 108 101 - 111 mmol/L   CO2 24 22 - 32 mmol/L   Glucose, Bld 79 65 - 99 mg/dL   BUN 8 6 - 20 mg/dL   Creatinine, Ser 0.57 0.44 - 1.00 mg/dL   Calcium 9.1 8.9 - 10.3 mg/dL   GFR calc non Af Amer >60 >60 mL/min   GFR calc Af Amer >60 >60 mL/min  Comment: (NOTE) The eGFR has been calculated using the CKD EPI equation. This calculation has not been validated in all clinical situations. eGFR's persistently <60 mL/min signify possible Chronic Kidney Disease.    Anion gap 7 5 - 15   No results found.  ROS NO RECENT ILLNESSES OR HOSPITALIZATIONS  Blood pressure 127/62, pulse 63, temperature 98.5 F (36.9 C), resp. rate 18, height '5\' 6"'  (1.676 m), weight 77.111 kg (170 lb), last menstrual period 11/14/2014, SpO2 100 %. Physical Exam  General Appearance:  Alert, cooperative, no distress, appears stated age  Head:  Normocephalic, without obvious abnormality, atraumatic  Eyes:  Pupils equal, conjunctiva/corneas clear,         Throat: Lips, mucosa, and tongue normal; teeth and gums normal  Neck: No visible masses     Lungs:   respirations unlabored  Chest Wall:  No tenderness or deformity  Heart:  Regular rate and rhythm,  Abdomen:   Soft,  non-tender,         Extremities: LEFT THUMB: SKIN INTACT FINGERS WARM  WELL PERFUSED TTP OVER ULNAR COLLATERAL LIGAMENT WITHOUT GOOD END POINT TO THUMB WITH STRESS OF UCL GOOD DIGITAL MOBILITY  Pulses: 2+ and symmetric  Skin: Skin color, texture, turgor normal, no rashes or lesions     Neurologic: Normal    Assessment/Plan LEFT THUMB ULNAR MP COLLATERAL LIGAMENT TEAR WITH INSTABILITY  LEFT THUMB OPEN LIGAMENT REPAIR AND OR RECONSTRUCTION  R/B/A DISCUSSED WITH PT IN OFFICE.  PT VOICED UNDERSTANDING OF PLAN CONSENT SIGNED DAY OF SURGERY PT SEEN AND EXAMINED PRIOR TO OPERATIVE PROCEDURE/DAY OF SURGERY SITE MARKED. QUESTIONS ANSWERED WILL GO HOME FOLLOWING SURGERY  WE ARE PLANNING SURGERY FOR YOUR UPPER EXTREMITY. THE RISKS AND BENEFITS OF SURGERY INCLUDE BUT NOT LIMITED TO BLEEDING INFECTION, DAMAGE TO NEARBY NERVES ARTERIES TENDONS, FAILURE OF SURGERY TO ACCOMPLISH ITS INTENDED GOALS, PERSISTENT SYMPTOMS AND NEED FOR FURTHER SURGICAL INTERVENTION. WITH THIS IN MIND WE WILL PROCEED. I HAVE DISCUSSED WITH THE PATIENT THE PRE AND POSTOPERATIVE REGIMEN AND THE DOS AND DON'TS. PT VOICED UNDERSTANDING AND INFORMED CONSENT SIGNED.  Linna Hoff 11/15/2014, 1:41 PM

## 2014-11-15 NOTE — Brief Op Note (Signed)
11/15/2014  1:44 PM  PATIENT:  Barbara Summers  34 y.o. female  PRE-OPERATIVE DIAGNOSIS:  LEFT THUMB ULNA COLLETERAL LIGAMENT TEAR  POST-OPERATIVE DIAGNOSIS:  * No post-op diagnosis entered *  PROCEDURE:  Procedure(s): LEFT THUMB ULNAR COLLATERAL LIGAMENT REPAIR AND OR RECONTSTRUCTION (Left)  SURGEON:  Surgeon(s) and Role:    * Bradly Bienenstock, MD - Primary  PHYSICIAN ASSISTANT:   ASSISTANTS: none   ANESTHESIA:   general  EBL:     BLOOD ADMINISTERED:none  DRAINS: none   LOCAL MEDICATIONS USED:  MARCAINE     SPECIMEN:  No Specimen  DISPOSITION OF SPECIMEN:  N/A  COUNTS:  YES  TOURNIQUET:    DICTATION: .Other Dictation: Dictation Number [id number]  PLAN OF CARE: Discharge to home after PACU  PATIENT DISPOSITION:  PACU - hemodynamically stable.   Delay start of Pharmacological VTE agent (>24hrs) due to surgical blood loss or risk of bleeding: not applicable

## 2014-11-16 ENCOUNTER — Encounter (HOSPITAL_COMMUNITY): Payer: Self-pay | Admitting: Orthopedic Surgery

## 2014-11-16 NOTE — Op Note (Signed)
Barbara Summers, Barbara Summers                ACCOUNT NO.:  [id number]  MEDICAL RECORD NO.:  [id number]  LOCATION:  [facility]                         FACILITY:  [facility]  PHYSICIAN:  Sharma Covert IV, M.D.DATE OF BIRTH:  [birth year]  DATE OF PROCEDURE:  11/15/2014 DATE OF DISCHARGE:  11/15/2014                              OPERATIVE REPORT   PREOPERATIVE DIAGNOSIS:  Left thumb ulnar collateral ligament tear at metacarpophalangeal joint.  POSTOPERATIVE DIAGNOSIS:  Left thumb ulnar collateral ligament tear at metacarpophalangeal joint.  SURGEON:  Sharma Covert, M.D., who scrubbed and present for the entire procedure.  ASSISTANT SURGEON:  None.  ANESTHESIA:  General via LMA.  PROCEDURE: 1. Repair of left thumb ulnar collateral ligament tear. 2. Radiographs 2 views, left thumb.  SURGICAL INDICATIONS:  Barbara Summers is a right-hand-dominant female who sustained an injury to her left thumb.  The patient was seen and evaluated in the office.  Given the nature of her injury, it is recommended she undergo the above procedure.  Risks, benefits, and alternatives were discussed in detail with the patient.  Signed informed consent was obtained.  Risks include, but not limited to bleeding; infection; damage to nearby nerves, arteries, or tendons; loss of motion wrist and digits; incomplete relief of symptoms; need for further surgical intervention.  DESCRIPTION OF PROCEDURE:  The patient was properly identified in the preoperative holding area and marked with a permanent marker made on the left thumb to indicate correct operative site.  The patient was brought back to the operating room, placed supine on the anesthesia table. General anesthesia was administered.  The patient tolerated this well. A well-padded tourniquet placed on left brachium, sealed with a 1000 drape.  The left upper extremity was prepped and draped in normal sterile fashion.  Time-out was called, correct site was identified, and the  procedure begun.  Stress examination was done of the thumb which showed incompetency of the ulnar collateral ligament with instability. Following this, a curvilinear incision was made directly over the thumb MP joint along the ulnar margin.  Dissection was carried down through the skin and subcutaneous tissue.  Distal branches of the sensory nerve of the radial nerve were then carefully protected and retracted. Following this, the adductor aponeurosis was incised longitudinally exposing the torn collateral ligament avulsed off the proximal phalanx. Following this, the bone was then repaired using small curettes.  Joint opened, no cartilaginous injuries were noted to the joint.  Following this, after preparation of the bone, the Arthrex anchor was then placed in the proximal phalanx.  Sutures then delivered through the ligament and tied down nicely over down to the bone.  The wound was then thoroughly irrigated.  After thorough wound irrigation, the final radiographs were then obtained of the thumb.  Once this was carried out, the capsular ligamentous complex was then closed with the remaining FiberWire suture.  The adductor aponeurosis was then closed with 3-0 Vicryl.  The skin was then closed with 4-0 Prolene sutures.  Adaptic dressing, sterile compressive bandage then applied.  The patient was placed in a well-padded thumb spica splint.  Taken to recovery room in good condition.  POSTOPERATIVE PLAN:  The patient was  discharged home, seen back in office in approximately 2 weeks for wound check, suture removal, and to a short-arm thumb spica cast for total 5 weeks for immobilization and begin a therapy regimen at the 5 week mark.  Radiographs at each visit.  RADIOGRAPHIC INTERPRETATION:  AP, lateral, and oblique views of the thumb did show the suture anchor fixation and good joint congruity of the thumb, MP joint.     Madelynn Done, M.D.     FWO/MEDQ  D:  11/15/2014  T:   11/16/2014  Job:  [id number]

## 2014-11-16 NOTE — Anesthesia Postprocedure Evaluation (Signed)
Anesthesia Post Note  Patient: Barbara Summers  Procedure(s) Performed: Procedure(s) (LRB): LEFT THUMB ULNAR COLLATERAL LIGAMENT REPAIR AND OR RECONTSTRUCTION (Left)  Anesthesia type: General  Patient location: PACU  Post pain: Pain level controlled  Post assessment: Post-op Vital signs reviewed  Last Vitals: BP 132/69 mmHg  Pulse 77  Temp(Src) 36.7 C  Resp 18  Ht  (1.676 m)  Wt 170 lb (77.111 kg)  BMI 27.45 kg/m2  SpO2 97%  LMP 11/14/2014  Post vital signs: Reviewed  Level of consciousness: sedated  Complications: No apparent anesthesia complications

## 2014-11-17 ENCOUNTER — Emergency Department (HOSPITAL_BASED_OUTPATIENT_CLINIC_OR_DEPARTMENT_OTHER)
Admission: EM | Admit: 2014-11-17 | Discharge: 2014-11-17 | Disposition: A | Discharge status: Home / Self Care | Payer: 59 | Attending: Emergency Medicine | Admitting: Emergency Medicine

## 2014-11-17 ENCOUNTER — Encounter (HOSPITAL_BASED_OUTPATIENT_CLINIC_OR_DEPARTMENT_OTHER): Payer: Self-pay | Admitting: *Deleted

## 2014-11-17 DIAGNOSIS — Y999 Unspecified external cause status: Secondary | ICD-10-CM | POA: Insufficient documentation

## 2014-11-17 DIAGNOSIS — Y939 Activity, unspecified: Secondary | ICD-10-CM | POA: Insufficient documentation

## 2014-11-17 DIAGNOSIS — S6992XA Unspecified injury of left wrist, hand and finger(s), initial encounter: Secondary | ICD-10-CM | POA: Diagnosis not present

## 2014-11-17 DIAGNOSIS — Y929 Unspecified place or not applicable: Secondary | ICD-10-CM | POA: Insufficient documentation

## 2014-11-17 DIAGNOSIS — Z91040 Latex allergy status: Secondary | ICD-10-CM | POA: Diagnosis not present

## 2014-11-17 DIAGNOSIS — M7989 Other specified soft tissue disorders: Secondary | ICD-10-CM | POA: Diagnosis present

## 2014-11-17 DIAGNOSIS — Z8709 Personal history of other diseases of the respiratory system: Secondary | ICD-10-CM | POA: Insufficient documentation

## 2014-11-17 DIAGNOSIS — Z8679 Personal history of other diseases of the circulatory system: Secondary | ICD-10-CM | POA: Insufficient documentation

## 2014-11-17 DIAGNOSIS — X58XXXA Exposure to other specified factors, initial encounter: Secondary | ICD-10-CM | POA: Diagnosis not present

## 2014-11-17 NOTE — Discharge Instructions (Signed)
Follow-up with hand surgery on Monday. But the loosening of the Ace wraps without the tape will probably help the swelling. Appears to be no significant complicating factors. Use the left arm sling as needed for comfort. Return for any new or worse symptoms. Continue to take your Motrin.

## 2014-11-17 NOTE — ED Notes (Signed)
Patient c/o left hand pain/swelling, patient had left thumb on Thursday & called surgeon on call due to swelling and pain and advised to come to er

## 2014-11-17 NOTE — ED Provider Notes (Signed)
CSN: [id number]     Arrival date & time 11/17/14  1191 History   First MD Initiated Contact with Patient 11/17/14 [phone]     Chief Complaint  Patient presents with  . hand swollen post-op      (Consider location/radiation/quality/duration/timing/severity/associated sxs/prior Treatment) The history is provided by the patient.   34 year old female status post left thumb ligament repair on Thursday by hand surgery. Patient noted swelling of the fingers today contacted hand surgery and she was recommended to be evaluated in the emergency department. No new injury. Patient does have a radial gutter splint in place and Ace wraps. Hand surgery told her it was okay to have the Ace wraps removed as necessary.  Past Medical History  Diagnosis Date  . History of bronchitis a yr ago  . History of migraine last one a month ago  . PONV (postoperative nausea and vomiting)    Past Surgical History  Procedure Laterality Date  . Cesarean section    . Knee arthroscopy    . Ectopic pregnancy surgery    . Tubal ligation    . Ulnar collateral ligament repair Left 11/15/2014    Procedure: LEFT THUMB ULNAR COLLATERAL LIGAMENT REPAIR AND OR RECONTSTRUCTION;  Surgeon: Bradly Bienenstock, MD;  Location: [facility] OR;  Service: Orthopedics;  Laterality: Left;   No family history on file. Social History  Substance Use Topics  . Smoking status: Never Smoker   . Smokeless tobacco: None  . Alcohol Use: Yes     Comment: occaionally   OB History    No data available     Review of Systems  Constitutional: Negative for fever.  HENT: Negative for congestion.   Eyes: Negative for visual disturbance.  Respiratory: Negative for shortness of breath.   Cardiovascular: Negative for chest pain.  Gastrointestinal: Negative for abdominal pain.  Genitourinary: Negative for dysuria.  Musculoskeletal: Negative for back pain.  Skin: Positive for wound. Negative for rash.  Neurological: Negative for numbness and headaches.   Hematological: Does not bruise/bleed easily.  Psychiatric/Behavioral: Negative for confusion.      Allergies  Hydromorphone hcl; Latex; Vancomycin; Flagyl; and Zithromax  Home Medications   Prior to Admission medications   Medication Sig Start Date End Date Taking? Authorizing Provider  oxyCODONE-acetaminophen (PERCOCET/ROXICET) 5-325 MG per tablet Take by mouth every 4 (four) hours as needed for severe pain.   Yes Historical Provider, MD  acetaminophen (TYLENOL) 500 MG tablet Take 1,000 mg by mouth every 6 (six) hours as needed for moderate pain.    Historical Provider, MD  benzonatate (TESSALON) 100 MG capsule Take 1 capsule (100 mg total) by mouth 3 (three) times daily as needed for cough. Patient not taking: Reported on 11/15/2014 05/08/14   Paula Libra, MD  docusate sodium (COLACE) 100 MG capsule Take 1 capsule (100 mg total) by mouth 2 (two) times daily. 11/15/14   Bradly Bienenstock, MD  doxycycline (VIBRAMYCIN) 100 MG capsule Take 1 capsule (100 mg total) by mouth 2 (two) times daily. One po bid x 7 days Patient not taking: Reported on 11/15/2014 05/08/14   Paula Libra, MD  Hydrocodone-Acetaminophen (VICODIN) 5-300 MG TABS Take 1 tablet by mouth 4 (four) times daily as needed (PAIN). 11/15/14   Bradly Bienenstock, MD  ondansetron (ZOFRAN ODT) 8 MG disintegrating tablet Take 1 tablet (8 mg total) by mouth every 8 (eight) hours as needed for nausea or vomiting. Patient not taking: Reported on 11/15/2014 05/08/14   Paula Libra, MD  vitamin C (ASCORBIC ACID) 500  MG tablet Take 1 tablet (500 mg total) by mouth daily. 11/15/14   Bradly Bienenstock, MD   BP 134/78 mmHg  Pulse 73  Temp(Src) 98.3 F (36.8 C) (Oral)  Resp 18  Ht  (1.676 m)  Wt 170 lb (77.111 kg)  BMI 27.45 kg/m2  SpO2 98%  LMP 11/14/2014 Physical Exam  Constitutional: She is oriented to person, place, and time. She appears well-developed and well-nourished. No distress.  HENT:  Head: Normocephalic and atraumatic.  Mouth/Throat:  Oropharynx is clear and moist.  Eyes: Conjunctivae and EOM are normal. Pupils are equal, round, and reactive to light.  Neck: Normal range of motion.  Cardiovascular: Normal rate, regular rhythm and normal heart sounds.   Pulmonary/Chest: Effort normal and breath sounds normal. No respiratory distress.  Abdominal: Soft. Bowel sounds are normal. She exhibits no distension.  Musculoskeletal: She exhibits edema.  Swelling to the fingers of the left hand. Not much swelling to the thumb. Patient in a radial gutter splint. Cap refill to the fingers of the left hand is 1 second. Sensation intact. Good range of motion. No swelling to the proximal arm.  Neurological: She is alert and oriented to person, place, and time. No cranial nerve deficit. She exhibits normal muscle tone. Coordination normal.  Skin: Skin is warm.  Nursing note and vitals reviewed.   ED Course  Procedures (including critical care time) Labs Review Labs Reviewed - No data to display  Imaging Review No results found. I have personally reviewed and evaluated these images and lab results as part of my medical decision-making.   EKG Interpretation None      MDM   Final diagnoses:  Thumb injury, left, initial encounter   Patient status post ligament repair to left thumb on Thursday by Dr. Melvyn Novas hand surgery. Patient with swelling to the other fingers of the hand was referred to the emergency department for evaluation.  Patient had had excessive amount of tape around the Ace wraps. That was removed the upper Ace wrap was removed. Patient has a radial gutter splint in place. No complicating factors. Thumb without significant swelling. Hand swelling is probably just due to the tightness of the Ace wrap. He'll probably be looser without the tape on it. Patient also advised to elevate is much as possible. Sling provided for comfort. Patient will continue Motrin. Patient will follow-up with hand surgery on Monday.  Refill to  the fingers is 2+. There is no arterial flow problems. No evidence of any complicating factors.  Vanetta Mulders, MD 11/17/14 1025

## 2015-01-31 ENCOUNTER — Encounter (HOSPITAL_BASED_OUTPATIENT_CLINIC_OR_DEPARTMENT_OTHER): Payer: Self-pay | Admitting: *Deleted

## 2015-01-31 ENCOUNTER — Emergency Department (HOSPITAL_BASED_OUTPATIENT_CLINIC_OR_DEPARTMENT_OTHER)
Admission: EM | Admit: 2015-01-31 | Discharge: 2015-01-31 | Disposition: A | Discharge status: Home / Self Care | Payer: 59 | Attending: Emergency Medicine | Admitting: Emergency Medicine

## 2015-01-31 ENCOUNTER — Emergency Department (HOSPITAL_BASED_OUTPATIENT_CLINIC_OR_DEPARTMENT_OTHER): Payer: 59

## 2015-01-31 DIAGNOSIS — Z79899 Other long term (current) drug therapy: Secondary | ICD-10-CM | POA: Diagnosis not present

## 2015-01-31 DIAGNOSIS — J029 Acute pharyngitis, unspecified: Secondary | ICD-10-CM | POA: Diagnosis present

## 2015-01-31 DIAGNOSIS — J069 Acute upper respiratory infection, unspecified: Secondary | ICD-10-CM | POA: Diagnosis not present

## 2015-01-31 DIAGNOSIS — Z91040 Latex allergy status: Secondary | ICD-10-CM | POA: Diagnosis not present

## 2015-01-31 DIAGNOSIS — Z8679 Personal history of other diseases of the circulatory system: Secondary | ICD-10-CM | POA: Diagnosis not present

## 2015-01-31 LAB — RAPID STREP SCREEN (MED CTR MEBANE ONLY): STREPTOCOCCUS, GROUP A SCREEN (DIRECT): NEGATIVE

## 2015-01-31 MED ORDER — HYDROCOD POLST-CPM POLST ER 10-8 MG/5ML PO SUER: 5.0000 mL | Freq: Two times a day (BID) | ORAL | Status: DC | PRN

## 2015-01-31 MED ORDER — GUAIFENESIN 100 MG/5ML PO SOLN
ORAL | Status: AC
  Administered 2015-01-31: 400 mg
  Filled 2015-01-31: qty 20

## 2015-01-31 MED ORDER — OXYMETAZOLINE HCL 0.05 % NA SOLN
1.0000 | Freq: Two times a day (BID) | NASAL | Status: DC | PRN
  Administered 2015-01-31: 2 via NASAL
  Filled 2015-01-31: qty 15

## 2015-01-31 MED ORDER — GUAIFENESIN 200 MG PO TABS
400.0000 mg | ORAL_TABLET | Freq: Once | ORAL | Status: DC
  Filled 2015-01-31: qty 2

## 2015-01-31 MED ORDER — HYDROCOD POLST-CPM POLST ER 10-8 MG/5ML PO SUER
5.0000 mL | Freq: Once | ORAL | Status: AC
  Administered 2015-01-31: 5 mL via ORAL
  Filled 2015-01-31: qty 5

## 2015-01-31 NOTE — ED Notes (Signed)
Pt c/o cough, congestion and body aches x 4 days  Fever up to 101

## 2015-01-31 NOTE — Discharge Instructions (Signed)
Upper Respiratory Infection, Adult Most upper respiratory infections (URIs) are a viral infection of the air passages leading to the lungs. A URI affects the nose, throat, and upper air passages. The most common type of URI is nasopharyngitis and is typically referred to as "the common cold." URIs run their course and usually go away on their own. Most of the time, a URI does not require medical attention, but sometimes a bacterial infection in the upper airways can follow a viral infection. This is called a secondary infection. Sinus and middle ear infections are common types of secondary upper respiratory infections. Bacterial pneumonia can also complicate a URI. A URI can worsen asthma and chronic obstructive pulmonary disease (COPD). Sometimes, these complications can require emergency medical care and may be life threatening.  CAUSES Almost all URIs are caused by viruses. A virus is a type of germ and can spread from one person to another.  RISKS FACTORS You may be at risk for a URI if:   You smoke.   You have chronic heart or lung disease.  You have a weakened defense (immune) system.   You are very young or very old.   You have nasal allergies or asthma.  You work in crowded or poorly ventilated areas.  You work in health care facilities or schools. SIGNS AND SYMPTOMS  Symptoms typically develop 2-3 days after you come in contact with a cold virus. Most viral URIs last 7-10 days. However, viral URIs from the influenza virus (flu virus) can last 14-18 days and are typically more severe. Symptoms may include:   Runny or stuffy (congested) nose.   Sneezing.   Cough.   Sore throat.   Headache.   Fatigue.   Fever.   Loss of appetite.   Pain in your forehead, behind your eyes, and over your cheekbones (sinus pain).  Muscle aches.  DIAGNOSIS  Your health care provider may diagnose a URI by:  Physical exam.  Tests to check that your symptoms are not due to  another condition such as:  Strep throat.  Sinusitis.  Pneumonia.  Asthma. TREATMENT  A URI goes away on its own with time. It cannot be cured with medicines, but medicines may be prescribed or recommended to relieve symptoms. Medicines may help:  Reduce your fever.  Reduce your cough.  Relieve nasal congestion. HOME CARE INSTRUCTIONS   Take medicines only as directed by your health care provider.   Gargle warm saltwater or take cough drops to comfort your throat as directed by your health care provider.  Use a warm mist humidifier or inhale steam from a shower to increase air moisture. This may make it easier to breathe.  Drink enough fluid to keep your urine clear or pale yellow.   Eat soups and other clear broths and maintain good nutrition.   Rest as needed.   Return to work when your temperature has returned to normal or as your health care provider advises. You may need to stay home longer to avoid infecting others. You can also use a face mask and careful hand washing to prevent spread of the virus.  Increase the usage of your inhaler if you have asthma.   Do not use any tobacco products, including cigarettes, chewing tobacco, or electronic cigarettes. If you need help quitting, ask your health care provider. PREVENTION  The best way to protect yourself from getting a cold is to practice good hygiene.   Avoid oral or hand contact with people with cold   symptoms.   Wash your hands often if contact occurs.  There is no clear evidence that vitamin C, vitamin E, echinacea, or exercise reduces the chance of developing a cold. However, it is always recommended to get plenty of rest, exercise, and practice good nutrition.  SEEK MEDICAL CARE IF:   You are getting worse rather than better.   Your symptoms are not controlled by medicine.   You have chills.  You have worsening shortness of breath.  You have brown or red mucus.  You have yellow or brown nasal  discharge.  You have pain in your face, especially when you bend forward.  You have a fever.  You have swollen neck glands.  You have pain while swallowing.  You have white areas in the back of your throat. SEEK IMMEDIATE MEDICAL CARE IF:   You have severe or persistent:  Headache.  Ear pain.  Sinus pain.  Chest pain.  You have chronic lung disease and any of the following:  Wheezing.  Prolonged cough.  Coughing up blood.  A change in your usual mucus.  You have a stiff neck.  You have changes in your:  Vision.  Hearing.  Thinking.  Mood. MAKE SURE YOU:   Understand these instructions.  Will watch your condition.  Will get help right away if you are not doing well or get worse.   This information is not intended to replace advice given to you by your health care provider. Make sure you discuss any questions you have with your health care provider.   Document Released: 08/12/2000 Document Revised: 07/03/2014 Document Reviewed: 05/24/2013 [employer] Interactive Patient Education 2016 [employer]  

## 2015-01-31 NOTE — ED Provider Notes (Signed)
CSN: [id number]     Arrival date & time 01/31/15  0244 History   First MD Initiated Contact with Patient 01/31/15 0304     Chief Complaint  Patient presents with  . URI     (Consider location/radiation/quality/duration/timing/severity/associated sxs/prior Treatment) HPI This is a 34 year old female with a three-day history of sore throat, nasal congestion, body aches, cough productive of thick green mucus and fever to 101. She has been taking ibuprofen to treat the fever but no other cough or cold medications. She states her throat pain was severe earlier in the week making swallowing difficult. Her most pressing symptom at the present time is nasal congestion.  Past Medical History  Diagnosis Date  . History of bronchitis a yr ago  . History of migraine last one a month ago  . PONV (postoperative nausea and vomiting)    Past Surgical History  Procedure Laterality Date  . Cesarean section    . Knee arthroscopy    . Ectopic pregnancy surgery    . Tubal ligation    . Ulnar collateral ligament repair Left 11/15/2014    Procedure: LEFT THUMB ULNAR COLLATERAL LIGAMENT REPAIR AND OR RECONTSTRUCTION;  Surgeon: Bradly BienenstockFred Ortmann, MD;  Location: [facility] OR;  Service: Orthopedics;  Laterality: Left;   No family history on file. Social History  Substance Use Topics  . Smoking status: Never Smoker   . Smokeless tobacco: None  . Alcohol Use: Yes     Comment: occaionally   OB History    No data available     Review of Systems  All other systems reviewed and are negative.     Allergies  Hydromorphone hcl; Latex; Vancomycin; Flagyl; and Zithromax  Home Medications   Prior to Admission medications   Medication Sig Start Date End Date Taking? Authorizing Provider  acetaminophen (TYLENOL) 500 MG tablet Take 1,000 mg by mouth every 6 (six) hours as needed for moderate pain.    Historical Provider, MD  benzonatate (TESSALON) 100 MG capsule Take 1 capsule (100 mg total) by mouth 3 (three) times  daily as needed for cough. Patient not taking: Reported on 11/15/2014 05/08/14   Paula LibraJohn Deiondra Denley, MD  docusate sodium (COLACE) 100 MG capsule Take 1 capsule (100 mg total) by mouth 2 (two) times daily. 11/15/14   Bradly BienenstockFred Ortmann, MD  doxycycline (VIBRAMYCIN) 100 MG capsule Take 1 capsule (100 mg total) by mouth 2 (two) times daily. One po bid x 7 days Patient not taking: Reported on 11/15/2014 05/08/14   Paula LibraJohn Clebert Wenger, MD  Hydrocodone-Acetaminophen (VICODIN) 5-300 MG TABS Take 1 tablet by mouth 4 (four) times daily as needed (PAIN). 11/15/14   Bradly BienenstockFred Ortmann, MD  ondansetron (ZOFRAN ODT) 8 MG disintegrating tablet Take 1 tablet (8 mg total) by mouth every 8 (eight) hours as needed for nausea or vomiting. Patient not taking: Reported on 11/15/2014 05/08/14   Paula LibraJohn Emely Fahy, MD  oxyCODONE-acetaminophen (PERCOCET/ROXICET) 5-325 MG per tablet Take by mouth every 4 (four) hours as needed for severe pain.    Historical Provider, MD  vitamin C (ASCORBIC ACID) 500 MG tablet Take 1 tablet (500 mg total) by mouth daily. 11/15/14   Bradly BienenstockFred Ortmann, MD   BP 149/89 mmHg  Pulse 83  Temp(Src) 98.1 F (36.7 C)  Resp 18  SpO2 100%  LMP 01/18/2015   Physical Exam  General: Well-developed, well-nourished female in no acute distress; appearance consistent with age of record HENT: normocephalic; atraumatic; nasal congestion; mild pharyngeal erythema without exudate Eyes: pupils equal, round and reactive  to light; extraocular muscles intact Neck: supple; anterior cervical lymphadenopathy Heart: regular rate and rhythm Lungs: clear to auscultation bilaterally Abdomen: soft; nondistended; nontender; bowel sounds present Extremities: No deformity; full range of motion; pulses normal Neurologic: Awake, alert and oriented; motor function intact in all extremities and symmetric; no facial droop Skin: Warm and dry Psychiatric: Flat affect    ED Course  Procedures (including critical care time)   MDM  Nursing notes and vitals signs,  including pulse oximetry, reviewed.  Summary of this visit's results, reviewed by myself:  Labs:  Results for orders placed or performed during the hospital encounter of 01/31/15 (from the past 24 hour(s))  Rapid strep screen     Status: None   Collection Time: 01/31/15  3:02 AM  Result Value Ref Range   Streptococcus, Group A Screen (Direct) NEGATIVE NEGATIVE    Imaging Studies: Dg Chest 2 View  01/31/2015  CLINICAL DATA:  Fever sore throat and congestion for 3 days. EXAM: CHEST  2 VIEW COMPARISON:  07/19/2009 FINDINGS: The heart size and mediastinal contours are within normal limits. Both lungs are clear. The visualized skeletal structures are unremarkable. IMPRESSION: No active cardiopulmonary disease. Electronically Signed   By: Ellery Plunk M.D.   On: 01/31/2015 03:25   Patient was advised of lab and CT findings. She was advised to buy over-the-counter Mucinex to help thin her mucus. We will provide symptomatic relief for her nasal congestion and cough.    Paula Libra, MD 01/31/15 [phone]

## 2015-01-31 NOTE — ED Notes (Signed)
Congestion, fever up to 101, body aches  Congestion, nasal congestion  (green)  sorethroat

## 2015-02-02 LAB — CULTURE, GROUP A STREP: STREP A CULTURE: NEGATIVE

## 2015-07-11 ENCOUNTER — Encounter (HOSPITAL_BASED_OUTPATIENT_CLINIC_OR_DEPARTMENT_OTHER): Payer: Self-pay | Admitting: Emergency Medicine

## 2015-07-11 ENCOUNTER — Emergency Department (HOSPITAL_BASED_OUTPATIENT_CLINIC_OR_DEPARTMENT_OTHER)
Admission: EM | Admit: 2015-07-11 | Discharge: 2015-07-11 | Disposition: A | Discharge status: Home / Self Care | Payer: 59 | Attending: Emergency Medicine | Admitting: Emergency Medicine

## 2015-07-11 DIAGNOSIS — R6884 Jaw pain: Secondary | ICD-10-CM

## 2015-07-11 DIAGNOSIS — K011 Impacted teeth: Secondary | ICD-10-CM

## 2015-07-11 MED ORDER — CLINDAMYCIN HCL 150 MG PO CAPS
300.0000 mg | ORAL_CAPSULE | Freq: Once | ORAL | Status: AC
  Administered 2015-07-11: 300 mg via ORAL
  Filled 2015-07-11: qty 2

## 2015-07-11 MED ORDER — OXYCODONE-ACETAMINOPHEN 5-325 MG PO TABS: 1.0000 | ORAL_TABLET | ORAL | Status: DC | PRN

## 2015-07-11 MED ORDER — CLINDAMYCIN HCL 150 MG PO CAPS: 150.0000 mg | ORAL_CAPSULE | Freq: Three times a day (TID) | ORAL | Status: DC

## 2015-07-11 MED ORDER — OXYCODONE-ACETAMINOPHEN 5-325 MG PO TABS
1.0000 | ORAL_TABLET | Freq: Once | ORAL | Status: AC
  Administered 2015-07-11: 1 via ORAL
  Filled 2015-07-11: qty 1

## 2015-07-11 NOTE — ED Notes (Signed)
Pt c/o right upper jaw pain onset since Tuesday this week radiates up to tempo and down neck. Also reports pain and difficulty opening mouth

## 2015-07-11 NOTE — ED Provider Notes (Signed)
CSN: [id number]     Arrival date & time 07/11/15  0251 History   First MD Initiated Contact with Patient 07/11/15 [phone]     Chief Complaint  Patient presents with  . Jaw Pain     (Consider location/radiation/quality/duration/timing/severity/associated sxs/prior Treatment) HPI  This is a 35 year old female with a three-day history of pain in her right TMJ and right upper third molar. The pain is moderate to severe and worse with opening or closing her mouth. Her right upper third molar is impacted and she relates her pain to this tooth as well. The pain radiates to the right side of her face. There is no associated swelling or redness.  Past Medical History  Diagnosis Date  . History of bronchitis a yr ago  . History of migraine last one a month ago  . PONV (postoperative nausea and vomiting)    Past Surgical History  Procedure Laterality Date  . Cesarean section    . Knee arthroscopy    . Ectopic pregnancy surgery    . Tubal ligation    . Ulnar collateral ligament repair Left 11/15/2014    Procedure: LEFT THUMB ULNAR COLLATERAL LIGAMENT REPAIR AND OR RECONTSTRUCTION;  Surgeon: Bradly BienenstockFred Ortmann, MD;  Location: [facility] OR;  Service: Orthopedics;  Laterality: Left;   History reviewed. No pertinent family history. Social History  Substance Use Topics  . Smoking status: Never Smoker   . Smokeless tobacco: None  . Alcohol Use: Yes     Comment: occaionally   OB History    No data available     Review of Systems  All other systems reviewed and are negative.   Allergies  Hydromorphone hcl; Latex; Vancomycin; Flagyl; and Zithromax  Home Medications   Prior to Admission medications   Medication Sig Start Date End Date Taking? Authorizing Provider  chlorpheniramine-HYDROcodone (TUSSIONEX PENNKINETIC ER) 10-8 MG/5ML SUER Take 5 mLs by mouth every 12 (twelve) hours as needed for cough. 01/31/15   Stepheni Cameron, MD  oxyCODONE-acetaminophen (PERCOCET/ROXICET) 5-325 MG per tablet Take by mouth  every 4 (four) hours as needed for severe pain.    Historical Provider, MD   BP 142/83 mmHg  Pulse 69  Temp(Src) 98.7 F (37.1 C) (Oral)  Resp 18  Ht 5\' 7"  (1.702 m)  Wt 170 lb (77.111 kg)  BMI 26.62 kg/m2  SpO2 100%  LMP 06/16/2015   Physical Exam  General: Well-developed, well-nourished female in no acute distress; appearance consistent with age of record HENT: normocephalic; atraumatic; tender right TMJ; impacted right upper third molar the erupted part of which appears to be carious and is tender to percussion Eyes: pupils equal, round and reactive to light; extraocular muscles intact Neck: supple Heart: regular rate and rhythm Lungs: Normal respiratory effort and excursion Abdomen: soft; nondistended Extremities: No deformity; full range of motion Neurologic: Awake, alert and oriented; motor function intact in all extremities and symmetric; no facial droop Skin: Warm and dry Psychiatric: Normal mood and affect    ED Course  Procedures (including critical care time)   MDM  It is unclear if this represents pain of an odontogenic source or TMJ dysfunction. She was advised that either way follow-up with an oral surgeon will be required.   Paula LibraJohn Navea Woodrow, MD 07/11/15 [phone]

## 2016-03-07 ENCOUNTER — Emergency Department (HOSPITAL_BASED_OUTPATIENT_CLINIC_OR_DEPARTMENT_OTHER): Payer: 59

## 2016-03-07 ENCOUNTER — Encounter (HOSPITAL_BASED_OUTPATIENT_CLINIC_OR_DEPARTMENT_OTHER): Payer: Self-pay | Admitting: Emergency Medicine

## 2016-03-07 ENCOUNTER — Emergency Department (HOSPITAL_BASED_OUTPATIENT_CLINIC_OR_DEPARTMENT_OTHER)
Admission: EM | Admit: 2016-03-07 | Discharge: 2016-03-07 | Disposition: A | Discharge status: Home / Self Care | Payer: 59 | Attending: Emergency Medicine | Admitting: Emergency Medicine

## 2016-03-07 DIAGNOSIS — N200 Calculus of kidney: Secondary | ICD-10-CM | POA: Diagnosis not present

## 2016-03-07 DIAGNOSIS — Z91040 Latex allergy status: Secondary | ICD-10-CM | POA: Insufficient documentation

## 2016-03-07 DIAGNOSIS — R197 Diarrhea, unspecified: Secondary | ICD-10-CM | POA: Insufficient documentation

## 2016-03-07 DIAGNOSIS — R109 Unspecified abdominal pain: Secondary | ICD-10-CM | POA: Diagnosis present

## 2016-03-07 LAB — COMPREHENSIVE METABOLIC PANEL
ALT: 21 U/L (ref 14–54)
AST: 15 U/L (ref 15–41)
Albumin: 4.2 g/dL (ref 3.5–5.0)
Alkaline Phosphatase: 65 U/L (ref 38–126)
Anion gap: 7 (ref 5–15)
BUN: 8 mg/dL (ref 6–20)
CHLORIDE: 107 mmol/L (ref 101–111)
CO2: 23 mmol/L (ref 22–32)
Calcium: 8.7 mg/dL — ABNORMAL LOW (ref 8.9–10.3)
Creatinine, Ser: 0.66 mg/dL (ref 0.44–1.00)
GFR calc non Af Amer: >60 mL/min (ref >60)
Glucose, Bld: 87 mg/dL (ref 65–99)
POTASSIUM: 3.7 mmol/L (ref 3.5–5.1)
Sodium: 137 mmol/L (ref 135–145)
Total Bilirubin: 0.7 mg/dL (ref 0.3–1.2)
Total Protein: 7.3 g/dL (ref 6.5–8.1)

## 2016-03-07 LAB — LIPASE, BLOOD: LIPASE: 31 U/L (ref 11–51)

## 2016-03-07 LAB — CBC
HCT: 41.4 % (ref 36.0–46.0)
HEMOGLOBIN: 13.8 g/dL (ref 12.0–15.0)
MCH: 29.7 pg (ref 26.0–34.0)
MCHC: 33.3 g/dL (ref 30.0–36.0)
MCV: 89.2 fL (ref 78.0–100.0)
Platelets: 258 10*3/uL (ref 150–400)
RBC: 4.64 MIL/uL (ref 3.87–5.11)
RDW: 13.5 % (ref 11.5–15.5)
WBC: 9.7 10*3/uL (ref 4.0–10.5)

## 2016-03-07 LAB — URINALYSIS, MICROSCOPIC (REFLEX): RBC / HPF: NONE SEEN RBC/hpf (ref 0–5)

## 2016-03-07 LAB — URINALYSIS, ROUTINE W REFLEX MICROSCOPIC
Bilirubin Urine: NEGATIVE
GLUCOSE, UA: NEGATIVE mg/dL
Hgb urine dipstick: NEGATIVE
Ketones, ur: NEGATIVE mg/dL
Nitrite: NEGATIVE
PROTEIN: NEGATIVE mg/dL
Specific Gravity, Urine: 1.022 (ref 1.005–1.030)
pH: 6.5 (ref 5.0–8.0)

## 2016-03-07 LAB — PREGNANCY, URINE: Preg Test, Ur: NEGATIVE

## 2016-03-07 MED ORDER — ONDANSETRON HCL 4 MG/2ML IJ SOLN
4.0000 mg | Freq: Once | INTRAMUSCULAR | Status: AC
  Administered 2016-03-07: 4 mg via INTRAVENOUS
  Filled 2016-03-07: qty 2

## 2016-03-07 MED ORDER — HYDROCODONE-ACETAMINOPHEN 5-325 MG PO TABS: 1.0000 | ORAL_TABLET | Freq: Four times a day (QID) | ORAL | 0 refills | Status: DC | PRN

## 2016-03-07 MED ORDER — SODIUM CHLORIDE 0.9 % IV BOLUS (SEPSIS)
1000.0000 mL | Freq: Once | INTRAVENOUS | Status: AC
  Administered 2016-03-07: 1000 mL via INTRAVENOUS

## 2016-03-07 MED ORDER — ONDANSETRON HCL 4 MG PO TABS: 4.0000 mg | ORAL_TABLET | Freq: Four times a day (QID) | ORAL | 0 refills | Status: DC

## 2016-03-07 MED ORDER — IOPAMIDOL (ISOVUE-300) INJECTION 61%
100.0000 mL | Freq: Once | INTRAVENOUS | Status: AC | PRN
  Administered 2016-03-07: 100 mL via INTRAVENOUS

## 2016-03-07 MED ORDER — MORPHINE SULFATE (PF) 4 MG/ML IV SOLN
4.0000 mg | Freq: Once | INTRAVENOUS | Status: AC
Start: 2016-03-07 — End: 2016-03-07
  Administered 2016-03-07: 4 mg via INTRAVENOUS
  Filled 2016-03-07: qty 1

## 2016-03-07 MED ORDER — KETOROLAC TROMETHAMINE 15 MG/ML IJ SOLN
15.0000 mg | Freq: Once | INTRAMUSCULAR | Status: AC
  Administered 2016-03-07: 15 mg via INTRAVENOUS
  Filled 2016-03-07: qty 1

## 2016-03-07 NOTE — ED Notes (Signed)
Specimen not collected. Pelvic exam only.

## 2016-03-07 NOTE — ED Notes (Signed)
Pt given d/c instructions as per chart. Rx x 2 with precautions. Verbalizes understanding. No questions. 

## 2016-03-07 NOTE — ED Triage Notes (Signed)
Pt reports sharp stabbing RLQ pain that awoke her from sleep this morning.  Pt reports mild RLQ pain yesterday with mild nausea.  Pt took 800mg  ibuprofen at approx 1300 today.

## 2016-03-07 NOTE — ED Provider Notes (Signed)
[facility] Provider Note   CSN: [id number] Arrival date & time: 03/07/16  1418   By signing my name below, I, [name], attest that this documentation has been prepared under the direction and in the presence of [name], MD  Electronically Signed: Clovis PuAvnee Patel, ED Scribe. 03/07/16. 3:23 PM.   History   Chief Complaint Chief Complaint  Patient presents with  . Abdominal Pain   The history is provided by the patient. No language interpreter was used.   HPI Comments:  [name] is a 36 y.o. female who presents to the Emergency Department complaining of sudden onset, sharp, right sided abdominal pain which began around 8:30 AM today. Her pain is worse with movement and after eating. She states her aching abdominal discomfort began yesterday and worsened today. Pt also reports nausea, diarrhea and a low-grade fevers (tmax 100.2). She has taken ibuprofen with no relief. Pt denies vomiting, dysuria, vaginal discharge, vaginal bleeding, vaginal itching, radiation of the pain, any other associated symptoms and any other modifying factors at this time. LMP on 02/16/16. She states today's pain is unrelated to pain she has experienced with her kidney stones.     Past Medical History:  Diagnosis Date  . History of bronchitis a yr ago  . History of migraine last one a month ago  . PONV (postoperative nausea and vomiting)     Patient Active Problem List   Diagnosis Date Noted  . SHOULDER PAIN, LEFT 12/05/2009  . ADHESIVE CAPSULITIS, LEFT 12/05/2009    Past Surgical History:  Procedure Laterality Date  . CESAREAN SECTION    . ECTOPIC PREGNANCY SURGERY    . KNEE ARTHROSCOPY    . TONSILLECTOMY    . TUBAL LIGATION    . ULNAR COLLATERAL LIGAMENT REPAIR Left 11/15/2014   Procedure: LEFT THUMB ULNAR COLLATERAL LIGAMENT REPAIR AND OR RECONTSTRUCTION;  Surgeon: Bradly BienenstockFred Ortmann, MD;  Location: [facility] OR;  Service: Orthopedics;  Laterality: Left;    OB History    No data  available       Home Medications    Prior to Admission medications   Medication Sig Start Date End Date Taking? Authorizing Provider  chlorpheniramine-HYDROcodone (TUSSIONEX PENNKINETIC ER) 10-8 MG/5ML SUER Take 5 mLs by mouth every 12 (twelve) hours as needed for cough. 01/31/15   John Molpus, MD  clindamycin (CLEOCIN) 150 MG capsule Take 1 capsule (150 mg total) by mouth 3 (three) times daily. 07/11/15   John Molpus, MD  oxyCODONE-acetaminophen (PERCOCET/ROXICET) 5-325 MG tablet Take 1 tablet by mouth every 4 (four) hours as needed for severe pain. 07/11/15   Paula LibraJohn Molpus, MD    Family History No family history on file.  Social History Social History  Substance Use Topics  . Smoking status: Never Smoker  . Smokeless tobacco: Never Used  . Alcohol use Yes     Comment: occasionally     Allergies   Hydromorphone hcl; Latex; Vancomycin; Flagyl [metronidazole]; and Zithromax [azithromycin]   Review of Systems Review of Systems  Gastrointestinal: Positive for abdominal pain, diarrhea and nausea. Negative for vomiting.  Genitourinary: Negative for dysuria, vaginal bleeding and vaginal discharge.  All other systems reviewed and are negative.  Physical Exam Updated Vital Signs BP 161/94 (BP Location: Right Arm)   Pulse 72   Temp 98.3 F (36.8 C) (Oral)   Resp 18   Ht 5\' 6"  (1.676 m)   Wt 184 lb (83.5 kg)   LMP 02/16/2016 (Exact Date)   SpO2 100%  BMI 29.70 kg/m   Physical Exam  Constitutional: She is oriented to person, place, and time. She appears well-developed and well-nourished. No distress.  HENT:  Head: Normocephalic and atraumatic.  Eyes: EOM are normal.  Neck: Normal range of motion.  Cardiovascular: Normal rate, regular rhythm and normal heart sounds.   Pulmonary/Chest: Effort normal and breath sounds normal.  Abdominal: Soft. She exhibits no distension. There is tenderness. There is rebound and guarding.  Pain, rebounding and guarding in the RLQ    Genitourinary: Vagina normal and uterus normal. Cervix exhibits no discharge. Right adnexum displays no tenderness. Left adnexum displays no tenderness. No tenderness or bleeding in the vagina. No vaginal discharge found.  Musculoskeletal: Normal range of motion.  Neurological: She is alert and oriented to person, place, and time.  Skin: Skin is warm and dry.  Psychiatric: She has a normal mood and affect. Judgment normal.  Nursing note and vitals reviewed.    ED Treatments / Results  DIAGNOSTIC STUDIES:  Oxygen Saturation is 100% on RA, normal by my interpretation.    COORDINATION OF CARE:  3:13 PM Discussed treatment plan with pt at bedside and pt agreed to plan.  Labs (all labs ordered are listed, but only abnormal results are displayed) Labs Reviewed  COMPREHENSIVE METABOLIC PANEL - Abnormal; Notable for the following:       Result Value   Calcium 8.7 (*)    All other components within normal limits  URINALYSIS, ROUTINE W REFLEX MICROSCOPIC - Abnormal; Notable for the following:    Leukocytes, UA MODERATE (*)    All other components within normal limits  URINALYSIS, MICROSCOPIC (REFLEX) - Abnormal; Notable for the following:    Bacteria, UA MANY (*)    Squamous Epithelial / LPF 6-30 (*)    All other components within normal limits  LIPASE, BLOOD  CBC  PREGNANCY, URINE  GC/CHLAMYDIA PROBE AMP ([place]) NOT AT [facility]    EKG  EKG Interpretation None       Radiology Ct Abdomen Pelvis W Contrast  Result Date: 03/07/2016 CLINICAL DATA:  Right lower quadrant pain EXAM: CT ABDOMEN AND PELVIS WITH CONTRAST TECHNIQUE: Multidetector CT imaging of the abdomen and pelvis was performed using the standard protocol following bolus administration of intravenous contrast. Oral contrast was also administered. CONTRAST:  ISOVUE-300 IOPAMIDOL (ISOVUE-300) INJECTION 61% COMPARISON:  None. FINDINGS: Lower chest: There is slight bibasilar atelectasis. Lung bases elsewhere are  clear. Hepatobiliary: There is a 1.6 x 1.3 cm cyst in the anterior segment of the right lobe of the liver. There is a 0.9 x 0.6 cm probable cyst in the posterior segment right lobe of the liver medially. There is fatty infiltration near the fissure for the ligamentum teres. No other focal liver lesions are evident. Gallbladder wall is not appreciably thickened. There is no biliary duct dilatation. Pancreas: No pancreatic mass or inflammatory focus. Spleen: No splenic lesions are evident. Adrenals/Urinary Tract: Adrenals appear normal bilaterally. Kidneys bilaterally show no evident mass on either side. There is no hydronephrosis on the left. There is a slight degree of hydronephrosis on the right. There is no intrarenal calculus on either side. There is a 2 mm calculus at the right ureterovesical junction. No other ureteral calculi are evident. Urinary bladder is midline with wall thickness within normal limits. Stomach/Bowel: There is no bowel wall or mesenteric thickening. There are a few sigmoid diverticula without diverticulitis. There is no appreciable bowel obstruction. No venous air. Vascular/Lymphatic: There is no abdominal aortic aneurysm.  No vascular lesions are evident. Incidental note is made of a circumaortic left renal vein, an anatomic variant. There is no evident adenopathy in the abdomen or pelvis. Reproductive: The uterus is anteverted. There is no pelvic mass or pelvic fluid collection. Other: Appendix appears unremarkable. There is no abscess or ascites in the abdomen or pelvis. There is a minimal ventral hernia containing only fat. Musculoskeletal: There are no blastic or lytic bone lesions. There is no intramuscular or abdominal wall lesion. IMPRESSION: 2 mm calculus right ureterovesical junction with slight hydronephrosis on the right. No bowel obstruction.  No abscess.  Appendix appears normal. Minimal ventral hernia containing only fat. Electronically Signed   By: Bretta Bang III M.D.    On: 03/07/2016 16:59    Procedures Procedures (including critical care time)  Medications Ordered in ED Medications - No data to display   Initial Impression / Assessment and Plan / ED Course  I have reviewed the triage vital signs and the nursing notes.  Pertinent labs & imaging results that were available during my care of the patient were reviewed by me and considered in my medical decision making (see chart for details).  Clinical Course    Patient is a 36 year old female with a history of ectopic pregnancy, tubal ligation presenting with severe right lower quadrant pain. She states she had some dull pain yesterday but around 2:00 this morning she woke up with severe pain. The pain has been localized in the right lower quadrant without much radiation. It does seem to be worse with movement and eating. She had several loose stools today and a low-grade temperature of 100.2. She denies any urinary or vaginal symptoms. Vital signs within normal limits except for hypertension but may be related to pain. Patient does have rebound and guarding in the right lower quadrant however no adnexal tenderness on pelvic exam. Potential for ectopic pregnancy, ovarian torsion or ruptured cyst versus right renal stone or appendicitis. Given no pain with palpation of the ovary on exam we'll start with a CT of the abdomen and pelvis. Pain is improved after morphine and Zofran. Labs with normal CBC, lipase, CMP. UA shows moderate leukocytes but 0-5 white blood cells.  5:13 PM Imaging consistent with 2mm stone.  Pt d/ced home with pain control.  Final Clinical Impressions(s) / ED Diagnoses   Final diagnoses:  Kidney stone on right side    New Prescriptions New Prescriptions   HYDROCODONE-ACETAMINOPHEN (NORCO/VICODIN) 5-325 MG TABLET    Take 1-2 tablets by mouth every 6 (six) hours as needed.   ONDANSETRON (ZOFRAN) 4 MG TABLET    Take 1 tablet (4 mg total) by mouth every 6 (six) hours.   I personally  performed the services described in this documentation, which was scribed in my presence.  The recorded information has been reviewed and considered.     Gwyneth Sprout, MD 03/07/16 1718

## 2016-03-20 ENCOUNTER — Ambulatory Visit (HOSPITAL_BASED_OUTPATIENT_CLINIC_OR_DEPARTMENT_OTHER)
Admission: RE | Admit: 2016-03-20 | Discharge: 2016-03-20 | Disposition: A | Discharge status: Home / Self Care | Payer: 59 | Source: Ambulatory Visit | Attending: Family Medicine | Admitting: Family Medicine

## 2016-03-20 ENCOUNTER — Other Ambulatory Visit (HOSPITAL_BASED_OUTPATIENT_CLINIC_OR_DEPARTMENT_OTHER): Payer: Self-pay | Admitting: Family Medicine

## 2016-03-20 DIAGNOSIS — N3289 Other specified disorders of bladder: Secondary | ICD-10-CM | POA: Insufficient documentation

## 2016-03-20 DIAGNOSIS — N8311 Corpus luteum cyst of right ovary: Secondary | ICD-10-CM | POA: Diagnosis not present

## 2016-03-20 DIAGNOSIS — R109 Unspecified abdominal pain: Secondary | ICD-10-CM | POA: Insufficient documentation

## 2016-04-23 ENCOUNTER — Ambulatory Visit: Admit: 2016-04-23 | Payer: 59 | Admitting: Obstetrics and Gynecology

## 2016-04-23 SURGERY — LAPAROSCOPY, DIAGNOSTIC
Anesthesia: General | Laterality: Right

## 2016-05-26 ENCOUNTER — Emergency Department (HOSPITAL_BASED_OUTPATIENT_CLINIC_OR_DEPARTMENT_OTHER): Payer: 59

## 2016-05-26 ENCOUNTER — Emergency Department (HOSPITAL_BASED_OUTPATIENT_CLINIC_OR_DEPARTMENT_OTHER)
Admission: EM | Admit: 2016-05-26 | Discharge: 2016-05-26 | Disposition: A | Discharge status: Home / Self Care | Payer: 59 | Attending: Physician Assistant | Admitting: Physician Assistant

## 2016-05-26 ENCOUNTER — Encounter (HOSPITAL_BASED_OUTPATIENT_CLINIC_OR_DEPARTMENT_OTHER): Payer: Self-pay

## 2016-05-26 DIAGNOSIS — M25531 Pain in right wrist: Secondary | ICD-10-CM | POA: Diagnosis not present

## 2016-05-26 DIAGNOSIS — M79641 Pain in right hand: Secondary | ICD-10-CM | POA: Diagnosis present

## 2016-05-26 DIAGNOSIS — R200 Anesthesia of skin: Secondary | ICD-10-CM | POA: Insufficient documentation

## 2016-05-26 MED ORDER — CYCLOBENZAPRINE HCL 10 MG PO TABS: 10.0000 mg | ORAL_TABLET | Freq: Two times a day (BID) | ORAL | 0 refills | Status: DC | PRN

## 2016-05-26 MED ORDER — OXYCODONE-ACETAMINOPHEN 5-325 MG PO TABS
1.0000 | ORAL_TABLET | Freq: Once | ORAL | Status: AC
  Administered 2016-05-26: 1 via ORAL
  Filled 2016-05-26: qty 1

## 2016-05-26 NOTE — ED Provider Notes (Signed)
[facility]   CSN: [id number] Arrival date & time: 05/26/16  1635    By signing my name below, I, Valentino Saxon, attest that this documentation has been prepared under the direction and in the presence of [place] Randall An, MD. Electronically Signed: Valentino Saxon, ED Scribe. 05/26/16. 6:39 PM.  History   Chief Complaint Chief Complaint  Patient presents with  . Hand Pain   The history is provided by the patient and the spouse. No language interpreter was used.   HPI Comments: Barbara Summers is a 36 y.o. female with no pertinent PMHx who presents to the Emergency Department complaining of gradually worsening, constant, right hand pain accompanied by swelling onset a week ago. She denies recent injury or trauma to the affected area. Pt notes her pain radiates down towards her right wrist and into her distal thumb. She describes the pain as a burning sensation followed by a sensation of mild numbness. Pt notes she woke up three days ago with numbness radiating throughout her arm and notes it gradually subsiding. She states her pain is worsened with finger movements and direct pressure. No alleviating factors noted. She denies fever.   Past Medical History:  Diagnosis Date  . History of bronchitis a yr ago  . History of migraine last one a month ago  . PONV (postoperative nausea and vomiting)     Patient Active Problem List   Diagnosis Date Noted  . SHOULDER PAIN, LEFT 12/05/2009  . ADHESIVE CAPSULITIS, LEFT 12/05/2009    Past Surgical History:  Procedure Laterality Date  . CESAREAN SECTION    . ECTOPIC PREGNANCY SURGERY    . KNEE ARTHROSCOPY    . TONSILLECTOMY    . TUBAL LIGATION    . ULNAR COLLATERAL LIGAMENT REPAIR Left 11/15/2014   Procedure: LEFT THUMB ULNAR COLLATERAL LIGAMENT REPAIR AND OR RECONTSTRUCTION;  Surgeon: Bradly Bienenstock, MD;  Location: [facility] OR;  Service: Orthopedics;  Laterality: Left;    OB History    No data available         Home Medications    Prior to Admission medications   Medication Sig Start Date End Date Taking? Authorizing Provider  chlorpheniramine-HYDROcodone (TUSSIONEX PENNKINETIC ER) 10-8 MG/5ML SUER Take 5 mLs by mouth every 12 (twelve) hours as needed for cough. 01/31/15   John Molpus, MD  clindamycin (CLEOCIN) 150 MG capsule Take 1 capsule (150 mg total) by mouth 3 (three) times daily. 07/11/15   John Molpus, MD  HYDROcodone-acetaminophen (NORCO/VICODIN) 5-325 MG tablet Take 1-2 tablets by mouth every 6 (six) hours as needed. 03/07/16   Gwyneth Sprout, MD  ondansetron (ZOFRAN) 4 MG tablet Take 1 tablet (4 mg total) by mouth every 6 (six) hours. 03/07/16   Gwyneth Sprout, MD  oxyCODONE-acetaminophen (PERCOCET/ROXICET) 5-325 MG tablet Take 1 tablet by mouth every 4 (four) hours as needed for severe pain. 07/11/15   Paula Libra, MD    Family History No family history on file.  Social History Social History  Substance Use Topics  . Smoking status: Never Smoker  . Smokeless tobacco: Never Used  . Alcohol use Yes     Comment: occasionally     Allergies   Hydromorphone hcl; Latex; Vancomycin; Flagyl [metronidazole]; and Zithromax [azithromycin]   Review of Systems Review of Systems  Constitutional: Negative for fever.  Musculoskeletal: Positive for arthralgias, joint swelling and myalgias.  Neurological: Positive for numbness.  All other systems reviewed and are negative.    Physical Exam Updated Vital Signs  BP (!) 155/98 (BP Location: Left Arm)   Pulse 71   Temp 99.1 F (37.3 C) (Oral)   Resp 18   Ht 5\' 7"  (1.702 m)   Wt 180 lb (81.6 kg)   LMP 05/12/2016   SpO2 100%   BMI 28.19 kg/m   Physical Exam  Constitutional: She appears well-developed and well-nourished.  HENT:  Head: Normocephalic and atraumatic.  Eyes: Conjunctivae are normal. Right eye exhibits no discharge. Left eye exhibits no discharge.  Pulmonary/Chest: Effort normal. No respiratory distress.   Musculoskeletal: Normal range of motion.  Full ROM. Good sensation and capillary refill.   Neurological: She is alert. Coordination normal.  Skin: Skin is warm and dry. No rash noted. She is not diaphoretic. No erythema.  Psychiatric: She has a normal mood and affect.  Nursing note and vitals reviewed.    ED Treatments / Results   DIAGNOSTIC STUDIES: Oxygen Saturation is 100% on RA, normal by my interpretation.    COORDINATION OF CARE: 6:29 PM Discussed treatment plan with pt at bedside which includes right hand imaging, cock-up splint, muscle relaxants and f/u with orthopedist and pt agreed to plan.   Labs (all labs ordered are listed, but only abnormal results are displayed) Labs Reviewed - No data to display  EKG  EKG Interpretation None       Radiology Dg Wrist Complete Right  Result Date: 05/26/2016 CLINICAL DATA:  Snuffbox pain after remote trauma. EXAM: RIGHT WRIST - COMPLETE 3+ VIEW COMPARISON:  None. FINDINGS: There is no evidence of fracture or dislocation. Dedicated view of the scaphoid demonstrates no acute fracture nor bone destruction. There is no evidence of arthropathy or other focal bone abnormality. Soft tissues are unremarkable. IMPRESSION: No acute nor worrisome osseous abnormalities. Electronically Signed   By: Tollie Ethavid  Kwon M.D.   On: 05/26/2016 17:08   Dg Hand Complete Right  Result Date: 05/26/2016 CLINICAL DATA:  Right hand pain, attention scaphoid after suspected injury in December, 2017. EXAM: RIGHT HAND - COMPLETE 3+ VIEW COMPARISON:  None. FINDINGS: There is no evidence of acute fracture or dislocation. There is no evidence of arthropathy or other focal bone abnormality. Soft tissues are unremarkable. IMPRESSION: Negative for acute fracture or malalignment of the right hand and wrist. No frank bone destruction. Electronically Signed   By: Tollie Ethavid  Kwon M.D.   On: 05/26/2016 17:07    Procedures Procedures (including critical care time)  Medications  Ordered in ED Medications  oxyCODONE-acetaminophen (PERCOCET/ROXICET) 5-325 MG per tablet 1 tablet (not administered)     Initial Impression / Assessment and Plan / ED Course  I have reviewed the triage vital signs and the nursing notes.  Pertinent labs & imaging results that were available during my care of the patient were reviewed by me and considered in my medical decision making (see chart for details).    I personally performed the services described in this documentation, which was scribed in my presence. The recorded information has been reviewed and is accurate.   Patient is here with right wrist pain. He has been ongoing for several days. Patient has had no trauma. She is likely overuse injury carpal tunnel versus otherwise. Patient has negative x-ray. Patient's full range of motion of thumb. Patient does have mild numbness however has good sensation on exam. We'll plan on treating conservatively with wrist splint. Patient has follow-up with orthopedist in 2 days.  Final Clinical Impressions(s) / ED Diagnoses   Final diagnoses:  Right wrist pain  New Prescriptions New Prescriptions   No medications on file     Avyay Coger Randall An, MD 05/26/16 1842

## 2016-05-26 NOTE — ED Triage Notes (Signed)
Pt c/o right hand pain and swelling without injury for a week, pain is distal to thumb and shoots up to wrist

## 2016-05-26 NOTE — Discharge Instructions (Signed)
We are unsure what your pain is from, it may be from carpal tunnel or an overuse injury. We have given you a splint and have you follo0w up with your already established orthopeadist.  Please return with concerns.

## 2016-06-23 ENCOUNTER — Emergency Department (HOSPITAL_COMMUNITY): Payer: 59

## 2016-06-23 ENCOUNTER — Emergency Department (HOSPITAL_COMMUNITY)
Admission: EM | Admit: 2016-06-23 | Discharge: 2016-06-24 | Disposition: A | Discharge status: Home / Self Care | Payer: 59 | Attending: Emergency Medicine | Admitting: Emergency Medicine

## 2016-06-23 ENCOUNTER — Encounter (HOSPITAL_COMMUNITY): Payer: Self-pay | Admitting: Emergency Medicine

## 2016-06-23 DIAGNOSIS — I10 Essential (primary) hypertension: Secondary | ICD-10-CM | POA: Diagnosis not present

## 2016-06-23 DIAGNOSIS — Z91040 Latex allergy status: Secondary | ICD-10-CM | POA: Insufficient documentation

## 2016-06-23 DIAGNOSIS — R0789 Other chest pain: Secondary | ICD-10-CM

## 2016-06-23 DIAGNOSIS — R079 Chest pain, unspecified: Secondary | ICD-10-CM | POA: Diagnosis present

## 2016-06-23 DIAGNOSIS — Z79899 Other long term (current) drug therapy: Secondary | ICD-10-CM | POA: Insufficient documentation

## 2016-06-23 HISTORY — DX: Essential (primary) hypertension: I10

## 2016-06-23 LAB — BASIC METABOLIC PANEL
Anion gap: 9 (ref 5–15)
BUN: 6 mg/dL (ref 6–20)
CALCIUM: 9.2 mg/dL (ref 8.9–10.3)
CO2: 23 mmol/L (ref 22–32)
CREATININE: 0.56 mg/dL (ref 0.44–1.00)
Chloride: 104 mmol/L (ref 101–111)
GFR calc non Af Amer: >60 mL/min (ref >60)
Glucose, Bld: 81 mg/dL (ref 65–99)
Potassium: 3.2 mmol/L — ABNORMAL LOW (ref 3.5–5.1)
SODIUM: 136 mmol/L (ref 135–145)

## 2016-06-23 LAB — CBC
HCT: 42 % (ref 36.0–46.0)
Hemoglobin: 14.4 g/dL (ref 12.0–15.0)
MCH: 30.2 pg (ref 26.0–34.0)
MCHC: 34.3 g/dL (ref 30.0–36.0)
MCV: 88.1 fL (ref 78.0–100.0)
PLATELETS: 296 10*3/uL (ref 150–400)
RBC: 4.77 MIL/uL (ref 3.87–5.11)
RDW: 13.2 % (ref 11.5–15.5)
WBC: 11.4 10*3/uL — AB (ref 4.0–10.5)

## 2016-06-23 LAB — I-STAT TROPONIN, ED
TROPONIN I, POC: 0 ng/mL (ref 0.00–0.08)
TROPONIN I, POC: 0 ng/mL (ref 0.00–0.08)

## 2016-06-23 LAB — D-DIMER, QUANTITATIVE (NOT AT ARMC): D DIMER QUANT: 0.3 ug{FEU}/mL (ref 0.00–0.50)

## 2016-06-23 MED ORDER — ONDANSETRON HCL 4 MG/2ML IJ SOLN
4.0000 mg | Freq: Once | INTRAMUSCULAR | Status: AC
  Administered 2016-06-23: 4 mg via INTRAVENOUS
  Filled 2016-06-23: qty 2

## 2016-06-23 MED ORDER — MORPHINE SULFATE (PF) 4 MG/ML IV SOLN
4.0000 mg | Freq: Once | INTRAVENOUS | Status: AC
  Administered 2016-06-23: 4 mg via INTRAVENOUS
  Filled 2016-06-23: qty 1

## 2016-06-23 NOTE — ED Triage Notes (Signed)
Pt brought to ED by [facility] form [facility] for sharp pain on her left side chest radiating to left arm, pt feeling dizzy a this time. High BP on EMS  Arrival 196/110, HR 80, SPO2 100% on RA, 324 mg Aspirin and 2 nitros sl given pta to ED without relief.

## 2016-06-23 NOTE — Discharge Instructions (Signed)
Avoid any strenuous activity. Please follow-up with family doctor if pain continues for further evaluation and treatment. Return to emergency department if worsening symptoms.

## 2016-06-23 NOTE — ED Provider Notes (Signed)
[facility] Provider Note   CSN: [id number] Arrival date & time: 06/23/16  1950     History   Chief Complaint Chief Complaint  Patient presents with  . Chest Pain    HPI Barbara Summers is a 36 y.o. female.  HPI Barbara Summers is a 36 y.o. 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 She states she developed associated dizziness, lightheadedness, nausea. She reports pain has been constant since then. She describes pain as sharp, but also feels like pressure. Pain is worsened with deep breathing. Denies any extremity swelling. No recent long car travels or flights. No recent surgeries. No cough or congestion. She states she went to urgent care and was sent here for further evaluation. She denies any prior medical problems. She has had elevated blood pressure and currently being closely monitored by her doctor. She states that when her pain started she went to [facility] and had her blood pressure checked and it was 160 systolic. She reports family history of blood pressure and cardiac problems. She denies being a smoker. He does not take any birth control. Denies being pregnant. No prior history of similar pain.  Past Medical History:  Diagnosis Date  . History of bronchitis a yr ago  . History of migraine last one a month ago  . Hypertension   . PONV (postoperative nausea and vomiting)     Patient Active Problem List   Diagnosis Date Noted  . SHOULDER PAIN, LEFT 12/05/2009  . ADHESIVE CAPSULITIS, LEFT 12/05/2009    Past Surgical History:  Procedure Laterality Date  . CESAREAN SECTION    . ECTOPIC PREGNANCY SURGERY    . KNEE ARTHROSCOPY    . TONSILLECTOMY    . TUBAL LIGATION    . ULNAR COLLATERAL LIGAMENT REPAIR Left 11/15/2014   Procedure: LEFT THUMB ULNAR COLLATERAL LIGAMENT REPAIR AND OR RECONTSTRUCTION;  Surgeon: Bradly Bienenstock, MD;  Location: [facility] OR;  Service: Orthopedics;  Laterality: Left;     OB History    No data available       Home Medications    Prior to Admission medications   Medication Sig Start Date End Date Taking? Authorizing Provider  chlorpheniramine-HYDROcodone (TUSSIONEX PENNKINETIC ER) 10-8 MG/5ML SUER Take 5 mLs by mouth every 12 (twelve) hours as needed for cough. 01/31/15   John Molpus, MD  clindamycin (CLEOCIN) 150 MG capsule Take 1 capsule (150 mg total) by mouth 3 (three) times daily. 07/11/15   John Molpus, MD  cyclobenzaprine (FLEXERIL) 10 MG tablet Take 1 tablet (10 mg total) by mouth 2 (two) times daily as needed for muscle spasms. 05/26/16   Courteney Lyn Mackuen, MD  HYDROcodone-acetaminophen (NORCO/VICODIN) 5-325 MG tablet Take 1-2 tablets by mouth every 6 (six) hours as needed. 03/07/16   Gwyneth Sprout, MD  ondansetron (ZOFRAN) 4 MG tablet Take 1 tablet (4 mg total) by mouth every 6 (six) hours. 03/07/16   Gwyneth Sprout, MD  oxyCODONE-acetaminophen (PERCOCET/ROXICET) 5-325 MG tablet Take 1 tablet by mouth every 4 (four) hours as needed for severe pain. 07/11/15   Paula Libra, MD    Family History History reviewed. No pertinent family history.  Social History Social History  Substance Use Topics  . Smoking status: Never Smoker  . Smokeless tobacco: Never Used  . Alcohol use Yes     Comment: occasionally     Allergies   Hydromorphone hcl; Latex; Vancomycin; Dilaudid [hydromorphone hcl]; Flagyl [metronidazole]; and Zithromax [azithromycin]  Review of Systems Review of Systems  Constitutional: Negative for chills and fever.  Respiratory: Positive for chest tightness and shortness of breath. Negative for cough.   Cardiovascular: Positive for chest pain. Negative for palpitations and leg swelling.  Gastrointestinal: Negative for abdominal pain, diarrhea, nausea and vomiting.  Genitourinary: Negative for dysuria, flank pain and pelvic pain.  Musculoskeletal: Negative for arthralgias, myalgias, neck pain and neck stiffness.  Skin:  Negative for rash.  Neurological: Negative for dizziness, weakness and headaches.  All other systems reviewed and are negative.    Physical Exam Updated Vital Signs BP (!) 141/92 (BP Location: Right Arm)   Pulse 88   Temp 98.4 F (36.9 C) (Oral)   Resp 20   Ht  (1.702 m)   Wt 83.5 kg   LMP 06/13/2016   SpO2 100%   BMI 28.82 kg/m   Physical Exam  Constitutional: She appears well-developed and well-nourished. No distress.  HENT:  Head: Normocephalic.  Eyes: Conjunctivae are normal.  Neck: Neck supple.  Cardiovascular: Normal rate, regular rhythm and normal heart sounds.   Pulmonary/Chest: Effort normal and breath sounds normal. No respiratory distress. She has no wheezes. She has no rales.  Abdominal: Soft. Bowel sounds are normal. She exhibits no distension. There is no tenderness. There is no rebound.  Musculoskeletal: She exhibits no edema.  Neurological: She is alert.  Skin: Skin is warm and dry.  Psychiatric: She has a normal mood and affect. Her behavior is normal.  Nursing note and vitals reviewed.    ED Treatments / Results  Labs (all labs ordered are listed, but only abnormal results are displayed) Labs Reviewed  BASIC METABOLIC PANEL - Abnormal; Notable for the following:       Result Value   Potassium 3.2 (*)    All other components within normal limits  CBC - Abnormal; Notable for the following:    WBC 11.4 (*)    All other components within normal limits  D-DIMER, QUANTITATIVE (NOT AT [facility])  Rosezena Sensor, ED    EKG  EKG Interpretation None       Radiology Dg Chest 2 View  Result Date: 06/23/2016 CLINICAL DATA:  Acute onset of left-sided chest pain, radiating down the left arm. Dizziness and shortness of breath. Initial encounter. EXAM: CHEST  2 VIEW COMPARISON:  Chest radiograph performed 01/31/2015 FINDINGS: The lungs are well-aerated and clear. There is no evidence of focal opacification, pleural effusion or pneumothorax. The heart  is normal in size; the mediastinal contour is within normal limits. No acute osseous abnormalities are seen. IMPRESSION: No acute cardiopulmonary process seen. Electronically Signed   By: Roanna Raider M.D.   On: 06/23/2016 21:11    Procedures Procedures (including critical care time)  Medications Ordered in ED Medications  morphine 4 MG/ML injection 4 mg (not administered)  ondansetron (ZOFRAN) injection 4 mg (not administered)     Initial Impression / Assessment and Plan / ED Course  I have reviewed the triage vital signs and the nursing notes.  Pertinent labs & imaging results that were available during my care of the patient were reviewed by me and considered in my medical decision making (see chart for details).     Asian seen and examined. Patient with nonexertional left-sided chest pain that has been constant since this morning with associated pleuritic component. She is hypertensive in the emergency department, currently not on any medications for blood pressure. Appears very anxious. Lab work obtained by triage resulted with negative troponin,  potassium 3.2, pulse 11.4. Negative chest x-ray. We will get d-dimer and monitor patient. Patient had nitroglycerin which did not help her pain by EMS. She had 4 baby aspirin's. Will add morphine for pain relief.  D dimer negative. Repeat trop negative. BP improving. Discussed findings with pt. She is a low risk CP, with heart score of 1. Will dc home with close outpatient follow up. Instructed to follow up with pcp. Return precautions discussed.   Vitals:   06/23/16 1957 06/23/16 2230 06/23/16 2330 06/24/16 0039  BP:  (!) 159/98 (!) 145/98 (!) 145/96  Pulse:  79 73 98  Resp:  Temp:      TempSrc:      SpO2:  99% 96% 98%  Weight: 83.5 kg     Height:  (1.702 m)        Final Clinical Impressions(s) / ED Diagnoses   Final diagnoses:  Atypical chest pain    New Prescriptions Discharge Medication List as of  06/23/2016 11:43 PM       Jaynie Crumble, PA-C 06/24/16 2322    Nira Conn, MD 06/27/16 [phone]

## 2016-08-04 ENCOUNTER — Encounter (HOSPITAL_BASED_OUTPATIENT_CLINIC_OR_DEPARTMENT_OTHER): Payer: Self-pay | Admitting: *Deleted

## 2016-08-04 ENCOUNTER — Emergency Department (HOSPITAL_BASED_OUTPATIENT_CLINIC_OR_DEPARTMENT_OTHER): Payer: 59

## 2016-08-04 ENCOUNTER — Emergency Department (HOSPITAL_BASED_OUTPATIENT_CLINIC_OR_DEPARTMENT_OTHER)
Admission: EM | Admit: 2016-08-04 | Discharge: 2016-08-04 | Disposition: A | Discharge status: Home / Self Care | Payer: 59 | Attending: Physician Assistant | Admitting: Physician Assistant

## 2016-08-04 DIAGNOSIS — Y9389 Activity, other specified: Secondary | ICD-10-CM | POA: Insufficient documentation

## 2016-08-04 DIAGNOSIS — Y929 Unspecified place or not applicable: Secondary | ICD-10-CM | POA: Diagnosis not present

## 2016-08-04 DIAGNOSIS — Y999 Unspecified external cause status: Secondary | ICD-10-CM | POA: Insufficient documentation

## 2016-08-04 DIAGNOSIS — I10 Essential (primary) hypertension: Secondary | ICD-10-CM | POA: Insufficient documentation

## 2016-08-04 DIAGNOSIS — M79642 Pain in left hand: Secondary | ICD-10-CM | POA: Diagnosis not present

## 2016-08-04 DIAGNOSIS — W228XXA Striking against or struck by other objects, initial encounter: Secondary | ICD-10-CM | POA: Insufficient documentation

## 2016-08-04 DIAGNOSIS — M79641 Pain in right hand: Secondary | ICD-10-CM

## 2016-08-04 DIAGNOSIS — S6992XA Unspecified injury of left wrist, hand and finger(s), initial encounter: Secondary | ICD-10-CM | POA: Diagnosis present

## 2016-08-04 MED ORDER — HYDROCODONE-ACETAMINOPHEN 5-325 MG PO TABS
1.0000 | ORAL_TABLET | Freq: Once | ORAL | Status: AC
  Administered 2016-08-04: 1 via ORAL
  Filled 2016-08-04: qty 1

## 2016-08-04 NOTE — ED Notes (Signed)
Patient states she doesn't need the splint states she already has the same one at home from a previous surgery.

## 2016-08-04 NOTE — ED Triage Notes (Signed)
Left hand injury. A ratchet hit her in the hand while she was helping her husband work on a car. Pain between her thumb and 2nd digit.

## 2016-08-04 NOTE — ED Provider Notes (Signed)
[facility]   CSN: [id number] Arrival date & time: 08/04/16  1636  By signing my name below, I, Thelma Barge, attest that this documentation has been prepared under the direction and in the presence of Wilburn Mylar. Electronically Signed: Thelma Barge, Scribe. 08/04/16. 5:03 PM.  History   Chief Complaint Chief Complaint  Patient presents with  . Hand Injury   The history is provided by the patient. No language interpreter was used.   HPI Comments: Barbara Summers is a 36 y.o. female with a PSHx of left-sided ulnar collateral ligament repair who presents to the Emergency Department complaining of constant, throbbing left hand pain that began prior to arrival. She states she has associated difficulty bending her thumb. 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 She also notes a knot in the area where her hand was hit. She has not taken any medication for the pain. She denies other associated symptoms. Pt has an appointment with her hand surgeon in 3 days.  Past Medical History:  Diagnosis Date  . History of bronchitis a yr ago  . History of migraine last one a month ago  . Hypertension   . PONV (postoperative nausea and vomiting)     Patient Active Problem List   Diagnosis Date Noted  . SHOULDER PAIN, LEFT 12/05/2009  . ADHESIVE CAPSULITIS, LEFT 12/05/2009    Past Surgical History:  Procedure Laterality Date  . CESAREAN SECTION    . ECTOPIC PREGNANCY SURGERY    . KNEE ARTHROSCOPY    . TONSILLECTOMY    . TUBAL LIGATION    . ULNAR COLLATERAL LIGAMENT REPAIR Left 11/15/2014   Procedure: LEFT THUMB ULNAR COLLATERAL LIGAMENT REPAIR AND OR RECONTSTRUCTION;  Surgeon: Bradly Bienenstock, MD;  Location: [facility] OR;  Service: Orthopedics;  Laterality: Left;    OB History    No data available       Home Medications    Prior to Admission medications   Medication Sig Start Date End Date Taking? Authorizing Provider    chlorpheniramine-HYDROcodone (TUSSIONEX PENNKINETIC ER) 10-8 MG/5ML SUER Take 5 mLs by mouth every 12 (twelve) hours as needed for cough. 01/31/15   Molpus, John, MD  clindamycin (CLEOCIN) 150 MG capsule Take 1 capsule (150 mg total) by mouth 3 (three) times daily. 07/11/15   Molpus, John, MD  cyclobenzaprine (FLEXERIL) 10 MG tablet Take 1 tablet (10 mg total) by mouth 2 (two) times daily as needed for muscle spasms. 05/26/16   Mackuen, Courteney Lyn, MD  HYDROcodone-acetaminophen (NORCO/VICODIN) 5-325 MG tablet Take 1-2 tablets by mouth every 6 (six) hours as needed. 03/07/16   Gwyneth Sprout, MD  ondansetron (ZOFRAN) 4 MG tablet Take 1 tablet (4 mg total) by mouth every 6 (six) hours. 03/07/16   Gwyneth Sprout, MD  oxyCODONE-acetaminophen (PERCOCET/ROXICET) 5-325 MG tablet Take 1 tablet by mouth every 4 (four) hours as needed for severe pain. 07/11/15   Molpus, John, MD    Family History No family history on file.  Social History Social History  Substance Use Topics  . Smoking status: Never Smoker  . Smokeless tobacco: Never Used  . Alcohol use Yes     Comment: occasionally     Allergies   Hydromorphone hcl; Latex; Vancomycin; Dilaudid [hydromorphone hcl]; Flagyl [metronidazole]; and Zithromax [azithromycin]   Review of Systems Review of Systems  Musculoskeletal: Positive for arthralgias.  Skin: Negative for wound.  Neurological: Negative for weakness and numbness.  Physical Exam Updated Vital Signs BP (!) 146/92   Pulse 78   Temp 99.1 F (37.3 C) (Oral)   Resp 18   Ht 5\' 6"  (1.676 m)   Wt 180 lb (81.6 kg)   LMP 07/16/2016   SpO2 100%   BMI 29.05 kg/m   Physical Exam  Constitutional: She is oriented to person, place, and time. She appears well-developed and well-nourished.  HENT:  Head: Normocephalic and atraumatic.  Cardiovascular: Normal rate and intact distal pulses.   Pulmonary/Chest: Effort normal.  Musculoskeletal:  Limited ROM of first MC joint.  Healed surgical incision over the left first [facility] joint Without any signs of overlying infection. Limited range of motion due to pain. Cap refill normal. Sensation intact to sharp dull. Radial pulses 2+ bilaterally. No open wound noted. Small amount of edema noted to the first MCP joint.   Small amount of tenderness to the left scaphoid region.  Neurological: She is alert and oriented to person, place, and time.  Skin: Skin is warm and dry. Capillary refill takes less than 2 seconds.  Psychiatric: She has a normal mood and affect.  Nursing note and vitals reviewed.    ED Treatments / Results  DIAGNOSTIC STUDIES: Oxygen Saturation is 100% on RA, normal by my interpretation.    COORDINATION OF CARE: 5:03 PM Discussed treatment plan with pt at bedside and pt agreed to plan.  Labs (all labs ordered are listed, but only abnormal results are displayed) Labs Reviewed - No data to display  EKG  EKG Interpretation None       Radiology Dg Hand Complete Left  Result Date: 08/04/2016 CLINICAL DATA:  Blunt trauma to left thumb today. Previous hand surgery several years ago. EXAM: LEFT HAND - COMPLETE 3+ VIEW COMPARISON:  None. FINDINGS: There is no evidence of fracture or dislocation. There is no evidence of arthropathy or other focal bone abnormality. Small surgical anchor projected over the base of the first proximal phalanx. Soft tissues are unremarkable. IMPRESSION: No acute findings. Electronically Signed   By: Elberta Fortis M.D.   On: 08/04/2016 16:58    Procedures Procedures (including critical care time)  Medications Ordered in ED Medications  HYDROcodone-acetaminophen (NORCO/VICODIN) 5-325 MG per tablet 1 tablet (1 tablet Oral Given 08/04/16 1705)     Initial Impression / Assessment and Plan / ED Course  I have reviewed the triage vital signs and the nursing notes.  Pertinent labs & imaging results that were available during my care of the patient were reviewed by me and  considered in my medical decision making (see chart for details).     Patient X-Ray negative for obvious fracture or dislocation.Patient with pain to palpation of the scaphoid region. We'll place an thumb spica splint. However patient states that she has the splint at home and will apply when she gets home. Has follow-up with her hand surgeon on Friday. Pain managed in ED. Pt advised to follow up with orthopedics if symptoms persist for possibility of missed fracture diagnosis. Dicussed conservative therapy recommended and discussed. Patient will be dc home & is agreeable with above plan.   Final Clinical Impressions(s) / ED Diagnoses   Final diagnoses:  Right hand pain    New Prescriptions New Prescriptions   No medications on file  I personally performed the services described in this documentation, which was scribed in my presence. The recorded information has been reviewed and is accurate.     Rise Mu, PA-C 08/04/16 1728  Abelino DerrickMackuen, Courteney Lyn, MD 08/07/16 [phone]

## 2016-08-04 NOTE — Discharge Instructions (Signed)
No fractures noted on the x-ray. We'll place her in a thumb spica splint given your significant pain. Please follow-up at her regularly scheduled orthopedic appointment on Friday. Rest, ice, elevate the hand. Motrin and Tylenol for pain.

## 2016-08-27 ENCOUNTER — Emergency Department (HOSPITAL_BASED_OUTPATIENT_CLINIC_OR_DEPARTMENT_OTHER)
Admission: EM | Admit: 2016-08-27 | Discharge: 2016-08-27 | Disposition: A | Discharge status: Home / Self Care | Payer: 59 | Attending: Emergency Medicine | Admitting: Emergency Medicine

## 2016-08-27 ENCOUNTER — Emergency Department (HOSPITAL_BASED_OUTPATIENT_CLINIC_OR_DEPARTMENT_OTHER): Payer: 59

## 2016-08-27 ENCOUNTER — Encounter (HOSPITAL_BASED_OUTPATIENT_CLINIC_OR_DEPARTMENT_OTHER): Payer: Self-pay | Admitting: *Deleted

## 2016-08-27 DIAGNOSIS — Z91040 Latex allergy status: Secondary | ICD-10-CM | POA: Diagnosis not present

## 2016-08-27 DIAGNOSIS — R079 Chest pain, unspecified: Secondary | ICD-10-CM | POA: Diagnosis present

## 2016-08-27 DIAGNOSIS — Z79899 Other long term (current) drug therapy: Secondary | ICD-10-CM | POA: Insufficient documentation

## 2016-08-27 DIAGNOSIS — I10 Essential (primary) hypertension: Secondary | ICD-10-CM | POA: Insufficient documentation

## 2016-08-27 LAB — BASIC METABOLIC PANEL
Anion gap: 8 (ref 5–15)
BUN: 8 mg/dL (ref 6–20)
CHLORIDE: 105 mmol/L (ref 101–111)
CO2: 24 mmol/L (ref 22–32)
CREATININE: 0.7 mg/dL (ref 0.44–1.00)
Calcium: 9.3 mg/dL (ref 8.9–10.3)
GFR calc Af Amer: >60 mL/min (ref >60)
GFR calc non Af Amer: >60 mL/min (ref >60)
Glucose, Bld: 86 mg/dL (ref 65–99)
POTASSIUM: 3.8 mmol/L (ref 3.5–5.1)
Sodium: 137 mmol/L (ref 135–145)

## 2016-08-27 LAB — D-DIMER, QUANTITATIVE: D-Dimer, Quant: <0.27 ug/mL-FEU (ref 0.00–0.50)

## 2016-08-27 LAB — CBC
HEMATOCRIT: 41.9 % (ref 36.0–46.0)
Hemoglobin: 14.6 g/dL (ref 12.0–15.0)
MCH: 30.6 pg (ref 26.0–34.0)
MCHC: 34.8 g/dL (ref 30.0–36.0)
MCV: 87.8 fL (ref 78.0–100.0)
PLATELETS: 277 10*3/uL (ref 150–400)
RBC: 4.77 MIL/uL (ref 3.87–5.11)
RDW: 13.1 % (ref 11.5–15.5)
WBC: 11.2 10*3/uL — ABNORMAL HIGH (ref 4.0–10.5)

## 2016-08-27 LAB — TROPONIN I
Troponin I: <0.03 ng/mL (ref <0.03)
Troponin I: <0.03 ng/mL (ref <0.03)

## 2016-08-27 MED ORDER — MORPHINE SULFATE (PF) 4 MG/ML IV SOLN
4.0000 mg | Freq: Once | INTRAVENOUS | Status: AC
  Administered 2016-08-27: 4 mg via INTRAVENOUS
  Filled 2016-08-27: qty 1

## 2016-08-27 MED ORDER — ONDANSETRON HCL 4 MG/2ML IJ SOLN
4.0000 mg | Freq: Once | INTRAMUSCULAR | Status: AC
  Administered 2016-08-27: 4 mg via INTRAVENOUS
  Filled 2016-08-27: qty 2

## 2016-08-27 MED ORDER — GI COCKTAIL ~~LOC~~
30.0000 mL | Freq: Once | ORAL | Status: AC
  Administered 2016-08-27: 30 mL via ORAL
  Filled 2016-08-27: qty 30

## 2016-08-27 NOTE — ED Triage Notes (Signed)
Chest pain since 5am. Sob and left sided pain. Worse when she takes a deep breath. Pain in her left arm after the pain in her chest.

## 2016-08-27 NOTE — ED Provider Notes (Signed)
[facility] Provider Note   CSN: [id number] Arrival date & time: 08/27/16  1148     History   Chief Complaint Chief Complaint  Patient presents with  . Chest Pain    HPI Barbara Summers is a 36 y.o. female.  HPI   36 year old female with history of hypertension, bronchitis presenting for evaluation of chest pain. Patient reports she was awoke this morning with pain in the left side of her chest. Pain described as a pressure sensation, with pleuritic component radiates to her left shoulder and arm. She endorse mild nausea without vomiting. She does endorse associate shortness of breath when taking deep breath. Symptom has been persistent throughout the day and she reported having associated diaphoresis while she was at work that was nonexertional, prompting patient to come to ER. Discomfort is mild at this time. No report of fever, chills, lightheadedness, dizziness, neck pain, abdominal pain, back pain, rash. She denies any prior history of PE or DVT, no recent surgery, prolonged bed rest, unilateral leg swelling or calf pain, active cancer or hemoptysis. She is not on any hormone pills. She did have one similar episode 2 months ago which she was seen in the ED. She was subsequently referred to cardiology. She is scheduled to have a cardiac stress tests on July 10. She was given Lipitor medication to help control her cholesterol level. She is not a smoker. She denies any increased stress. No history of heartburn. No recent strenuous activities or recent injury.  Past Medical History:  Diagnosis Date  . History of bronchitis a yr ago  . History of migraine last one a month ago  . Hypertension   . PONV (postoperative nausea and vomiting)     Patient Active Problem List   Diagnosis Date Noted  . SHOULDER PAIN, LEFT 12/05/2009  . ADHESIVE CAPSULITIS, LEFT 12/05/2009    Past Surgical History:  Procedure Laterality Date  . CESAREAN SECTION    . ECTOPIC PREGNANCY SURGERY     . KNEE ARTHROSCOPY    . TONSILLECTOMY    . TUBAL LIGATION    . ULNAR COLLATERAL LIGAMENT REPAIR Left 11/15/2014   Procedure: LEFT THUMB ULNAR COLLATERAL LIGAMENT REPAIR AND OR RECONTSTRUCTION;  Surgeon: Bradly BienenstockFred Ortmann, MD;  Location: [facility] OR;  Service: Orthopedics;  Laterality: Left;    OB History    No data available       Home Medications    Prior to Admission medications   Medication Sig Start Date End Date Taking? Authorizing Provider  Atorvastatin Calcium (LIPITOR PO) Take by mouth.   Yes [provider]  chlorpheniramine-HYDROcodone (TUSSIONEX PENNKINETIC ER) 10-8 MG/5ML SUER Take 5 mLs by mouth every 12 (twelve) hours as needed for cough. 01/31/15   Molpus, John, MD  clindamycin (CLEOCIN) 150 MG capsule Take 1 capsule (150 mg total) by mouth 3 (three) times daily. 07/11/15   Molpus, John, MD  cyclobenzaprine (FLEXERIL) 10 MG tablet Take 1 tablet (10 mg total) by mouth 2 (two) times daily as needed for muscle spasms. 05/26/16   Mackuen, Courteney Lyn, MD  HYDROcodone-acetaminophen (NORCO/VICODIN) 5-325 MG tablet Take 1-2 tablets by mouth every 6 (six) hours as needed. 03/07/16   Gwyneth SproutPlunkett, Whitney, MD  ondansetron (ZOFRAN) 4 MG tablet Take 1 tablet (4 mg total) by mouth every 6 (six) hours. 03/07/16   Gwyneth SproutPlunkett, Whitney, MD  oxyCODONE-acetaminophen (PERCOCET/ROXICET) 5-325 MG tablet Take 1 tablet by mouth every 4 (four) hours as needed for severe pain. 07/11/15   Molpus, Jonny RuizJohn, MD  Family History No family history on file.  Social History Social History  Substance Use Topics  . Smoking status: Never Smoker  . Smokeless tobacco: Never Used  . Alcohol use Yes     Comment: occasionally     Allergies   Hydromorphone hcl; Latex; Vancomycin; Dilaudid [hydromorphone hcl]; Flagyl [metronidazole]; and Zithromax [azithromycin]   Review of Systems Review of Systems  All other systems reviewed and are negative.    Physical Exam Updated Vital Signs BP (!) 151/99   Pulse 78    Temp 98.8 F (37.1 C)   Resp 20   Ht 5\' 6"  (1.676 m)   Wt 81.6 kg (180 lb)   LMP 08/13/2016   SpO2 98%   BMI 29.05 kg/m   Physical Exam  Constitutional: She appears well-developed and well-nourished. No distress.  HENT:  Head: Atraumatic.  Mouth/Throat: Oropharynx is clear and moist.  Eyes: Conjunctivae are normal.  Neck: Normal range of motion. Neck supple. No JVD present.  Cardiovascular: Normal rate and regular rhythm.   Pulmonary/Chest: Effort normal and breath sounds normal.  Abdominal: Soft. Bowel sounds are normal. She exhibits no distension. There is tenderness (Mild epigastric tenderness no guarding or rebound tenderness. Negative Murphy sign, no pain at McBurney's point).  Neurological: She is alert.  Skin: No rash noted.  Psychiatric: She has a normal mood and affect.  Nursing note and vitals reviewed.    ED Treatments / Results  Labs (all labs ordered are listed, but only abnormal results are displayed) Labs Reviewed  CBC - Abnormal; Notable for the following:       Result Value   WBC 11.2 (*)    All other components within normal limits  BASIC METABOLIC PANEL  TROPONIN I  D-DIMER, QUANTITATIVE (NOT AT [facility])  TROPONIN I    EKG  EKG Interpretation  Date/Time:  Thursday August 27 2016 11:56:17 EDT Ventricular Rate:  79 PR Interval:  120 QRS Duration: 82 QT Interval:  388 QTC Calculation: 444 R Axis:   67 Text Interpretation:  Normal sinus rhythm Normal ECG No acute changes No significant change since last tracing Confirmed by Derwood Kaplan ([id number]) on 08/27/2016 12:07:02 PM       Radiology Dg Chest Port 1 View  Result Date: 08/27/2016 CLINICAL DATA:  Onset of chest pain this morning at 5 a.m. associated with shortness of breath. The pain has a pleuritic component. Some left arm pain as well. History of hypertension, nonsmoker. EXAM: PORTABLE CHEST 1 VIEW COMPARISON:  Chest x-ray of June 23, 2016 FINDINGS: The lungs are mildly hypoinflated. There  is no focal infiltrate. There is no pleural effusion. The heart and pulmonary vascularity are normal for the portable technique. The mediastinum is normal in width. There is no pleural effusion. The observed bony thorax exhibits no acute abnormality. IMPRESSION: There is no acute cardiopulmonary abnormality. When the patient can tolerate the procedure, a well-expanded PA and lateral chest x-ray would be useful. Electronically Signed   By: David  [place] M.D.   On: 08/27/2016 12:19    Procedures Procedures (including critical care time)  Medications Ordered in ED Medications  gi cocktail (Maalox,Lidocaine,Donnatal) (30 mLs Oral Given 08/27/16 1319)  morphine 4 MG/ML injection 4 mg (4 mg Intravenous Given 08/27/16 1453)  ondansetron (ZOFRAN) injection 4 mg (4 mg Intravenous Given 08/27/16 1453)     Initial Impression / Assessment and Plan / ED Course  I have reviewed the triage vital signs and the nursing notes.  Pertinent labs & imaging  results that were available during my care of the patient were reviewed by me and considered in my medical decision making (see chart for details).     BP 131/76   Pulse (!) 58   Temp 98.8 F (37.1 C)   Resp 18   Ht 5\' 6"  (1.676 m)   Wt 81.6 kg (180 lb)   LMP 08/13/2016   SpO2 97%   BMI 29.05 kg/m    Final Clinical Impressions(s) / ED Diagnoses   Final diagnoses:  Nonspecific chest pain    New Prescriptions New Prescriptions   No medications on file   12:09 PM Patient here with nonexertional left-sided chest pain radiates to her left arm. She also endorsed pleuritic chest pain shortness of breath. HEART score of 1, low risk of MACE.  However, given c/o pleuritic CP with SOB without significant risk factor for PE, plan to obtain D-dimer.  Work up initiated.  GI cocktail given.    3:25 PM Chest x-ray shows no acute abnormalities. Initial troponin is negative. D-dimer is in the normal range. Labs are reassuring, no evidence of anemia,  electrolytes panel are reassuring. Patient received GI cocktail but report no improvement. Will provide additional symptomatic treatment. Plan to obtain a delta troponin.  3:56 PM Pain is mostly resolved. She has negative delta troponin. She feels comfortable being discharged. She will follow-up closely with her PCP and with her cardiologist for further evaluation. She does have a outpatient cardiac stress test on July 10. Return precaution discussed.   Fayrene Helper, PA-C 08/27/16 1558    Derwood Kaplan, MD 08/28/16 1344

## 2016-08-27 NOTE — ED Notes (Signed)
Pt reports no improvement with GI cocktail. Pt states pain is around L breast. Pt describes it as "sharp," and "someone sitting on my chest." Pt states it only hurts with respiration, and if she does not take a breath it does not hurt.

## 2016-08-27 NOTE — Discharge Instructions (Signed)
Please call and follow up closely with your primary care provider and with your cardiologist for further evaluation of your chest discomfort.  Return if your symptoms worsen or if you have other concerns.

## 2016-08-30 DIAGNOSIS — R011 Cardiac murmur, unspecified: Secondary | ICD-10-CM

## 2016-08-30 HISTORY — DX: Cardiac murmur, unspecified: R01.1

## 2016-09-15 ENCOUNTER — Other Ambulatory Visit: Payer: Self-pay | Admitting: Internal Medicine

## 2016-09-15 DIAGNOSIS — Z8249 Family history of ischemic heart disease and other diseases of the circulatory system: Secondary | ICD-10-CM

## 2016-09-19 ENCOUNTER — Other Ambulatory Visit: Payer: Self-pay

## 2016-09-25 ENCOUNTER — Other Ambulatory Visit: Payer: Self-pay

## 2016-11-16 ENCOUNTER — Emergency Department (HOSPITAL_BASED_OUTPATIENT_CLINIC_OR_DEPARTMENT_OTHER)
Admission: EM | Admit: 2016-11-16 | Discharge: 2016-11-16 | Disposition: A | Discharge status: Home / Self Care | Payer: 59 | Attending: Emergency Medicine | Admitting: Emergency Medicine

## 2016-11-16 ENCOUNTER — Emergency Department (HOSPITAL_BASED_OUTPATIENT_CLINIC_OR_DEPARTMENT_OTHER): Payer: 59

## 2016-11-16 ENCOUNTER — Encounter (HOSPITAL_BASED_OUTPATIENT_CLINIC_OR_DEPARTMENT_OTHER): Payer: Self-pay

## 2016-11-16 DIAGNOSIS — I10 Essential (primary) hypertension: Secondary | ICD-10-CM | POA: Diagnosis not present

## 2016-11-16 DIAGNOSIS — Z79899 Other long term (current) drug therapy: Secondary | ICD-10-CM | POA: Insufficient documentation

## 2016-11-16 DIAGNOSIS — Z91040 Latex allergy status: Secondary | ICD-10-CM | POA: Insufficient documentation

## 2016-11-16 DIAGNOSIS — R1011 Right upper quadrant pain: Secondary | ICD-10-CM | POA: Diagnosis not present

## 2016-11-16 LAB — COMPREHENSIVE METABOLIC PANEL
ALBUMIN: 4 g/dL (ref 3.5–5.0)
ALT: 32 U/L (ref 14–54)
AST: 21 U/L (ref 15–41)
Alkaline Phosphatase: 70 U/L (ref 38–126)
Anion gap: 5 (ref 5–15)
BILIRUBIN TOTAL: 0.8 mg/dL (ref 0.3–1.2)
BUN: 9 mg/dL (ref 6–20)
CHLORIDE: 106 mmol/L (ref 101–111)
CO2: 29 mmol/L (ref 22–32)
Calcium: 9.2 mg/dL (ref 8.9–10.3)
Creatinine, Ser: 0.69 mg/dL (ref 0.44–1.00)
GFR calc Af Amer: >60 mL/min (ref >60)
GFR calc non Af Amer: >60 mL/min (ref >60)
GLUCOSE: 70 mg/dL (ref 65–99)
POTASSIUM: 3.2 mmol/L — AB (ref 3.5–5.1)
Sodium: 140 mmol/L (ref 135–145)
TOTAL PROTEIN: 6.9 g/dL (ref 6.5–8.1)

## 2016-11-16 LAB — URINALYSIS, ROUTINE W REFLEX MICROSCOPIC
Bilirubin Urine: NEGATIVE
Glucose, UA: NEGATIVE mg/dL
Ketones, ur: NEGATIVE mg/dL
LEUKOCYTES UA: NEGATIVE
Nitrite: NEGATIVE
PH: 5.5 (ref 5.0–8.0)
Protein, ur: NEGATIVE mg/dL
SPECIFIC GRAVITY, URINE: 1.025 (ref 1.005–1.030)

## 2016-11-16 LAB — CBC WITH DIFFERENTIAL/PLATELET
BASOS ABS: 0 10*3/uL (ref 0.0–0.1)
BASOS PCT: 0 %
Eosinophils Absolute: 0.2 10*3/uL (ref 0.0–0.7)
Eosinophils Relative: 2 %
HEMATOCRIT: 38.7 % (ref 36.0–46.0)
HEMOGLOBIN: 13.1 g/dL (ref 12.0–15.0)
Lymphocytes Relative: 31 %
Lymphs Abs: 3.2 10*3/uL (ref 0.7–4.0)
MCH: 30.5 pg (ref 26.0–34.0)
MCHC: 33.9 g/dL (ref 30.0–36.0)
MCV: 90 fL (ref 78.0–100.0)
Monocytes Absolute: 0.7 10*3/uL (ref 0.1–1.0)
Monocytes Relative: 7 %
NEUTROS ABS: 6.3 10*3/uL (ref 1.7–7.7)
NEUTROS PCT: 60 %
Platelets: 274 10*3/uL (ref 150–400)
RBC: 4.3 MIL/uL (ref 3.87–5.11)
RDW: 13.2 % (ref 11.5–15.5)
WBC: 10.4 10*3/uL (ref 4.0–10.5)

## 2016-11-16 LAB — LIPASE, BLOOD: Lipase: 46 U/L (ref 11–51)

## 2016-11-16 LAB — PREGNANCY, URINE: Preg Test, Ur: NEGATIVE

## 2016-11-16 LAB — URINALYSIS, MICROSCOPIC (REFLEX)

## 2016-11-16 MED ORDER — ONDANSETRON HCL 4 MG/2ML IJ SOLN
4.0000 mg | Freq: Once | INTRAMUSCULAR | Status: AC
  Administered 2016-11-16: 4 mg via INTRAVENOUS
  Filled 2016-11-16: qty 2

## 2016-11-16 MED ORDER — MORPHINE SULFATE (PF) 4 MG/ML IV SOLN
4.0000 mg | Freq: Once | INTRAVENOUS | Status: AC
  Administered 2016-11-16: 4 mg via INTRAVENOUS
  Filled 2016-11-16: qty 1

## 2016-11-16 MED ORDER — OMEPRAZOLE 20 MG PO CPDR: 20.0000 mg | DELAYED_RELEASE_CAPSULE | Freq: Two times a day (BID) | ORAL | 0 refills | Status: DC

## 2016-11-16 MED FILL — OMEPRAZOLE 20 MG CAP: 20 | 30 days supply | Qty: 60 | Fill #0

## 2016-11-16 NOTE — ED Triage Notes (Signed)
C/o RUQ pain x months-has been seen in ED and by PCP-NAD-steady gait

## 2016-11-16 NOTE — Discharge Instructions (Signed)
Your workup today is reassuring. There is no evidence of acute liver, gallbladder problems. He do not have kidney stones or urinary tract infection. There is no signs of inflammation in the colon or bowel.  Your symptoms may also be suggestive of an ulcer or inflammation of the stomach. Take an acid inhibitor to see if that may eventually help his symptoms. You may need to see a GI doctor for further workup, and phone numbers provided for you.  Please return without fail for worsening symptoms, including fever, escalating pain, intractable vomiting, or any other symptoms concerning to you. Avoid taking any ibuprofen or anti-inflammatory medications. You can take Tylenol 500 mg to 650 mg every 6 hours for pain.

## 2016-11-16 NOTE — ED Notes (Signed)
Pt educated about not driving or performing other critical tasks (such as operating heavy machinery, caring for infant/toddler/child) due to sedative nature of medications received in ED. Also warned about risks of consuming alcohol or taking other medications with sedative properties. Pt/caregiver verbalized understanding.  

## 2016-11-16 NOTE — ED Provider Notes (Signed)
[facility]   CSN: [id number] Arrival date & time: 11/16/16  1455     History   Chief Complaint Chief Complaint  Patient presents with  . Abdominal Pain    HPI Barbara Summers is a 36 y.o. female.  HPI 36 year old female who presents with abdominal pain. She reports symptoms have been intermittent over the past 2 months. Reports that she has had CT scans in the past that showed kidney stones, of but also recently says that her LFTs have been intermittently elevated and she has been told that there has been mild inflammation around her gallbladder before. She states that over the past few weeks her symptoms have been worsening, more frequent, primarily associated with eating. She has had diet modifications including lower fat foods, without improvement. She has also been taking ibuprofen for symptoms without improvement. She has had constant right upper quadrant abdominal pain over the past 2 days. Has had intermittent nausea, vomiting, diarrhea but denies any fevers or chills. Denies any dysuria, hematuria, abnormal vaginal bleeding or discharge. She does have history of multiple cesarean sections, ovarian cystectomy, and laparoscopy for ectopic pregnancy.   Past Medical History:  Diagnosis Date  . History of bronchitis a yr ago  . History of migraine last one a month ago  . Hypertension   . Kidney stone   . PONV (postoperative nausea and vomiting)     Patient Active Problem List   Diagnosis Date Noted  . SHOULDER PAIN, LEFT 12/05/2009  . ADHESIVE CAPSULITIS, LEFT 12/05/2009    Past Surgical History:  Procedure Laterality Date  . CESAREAN SECTION    . ECTOPIC PREGNANCY SURGERY    . KNEE ARTHROSCOPY    . TONSILLECTOMY    . TUBAL LIGATION    . ULNAR COLLATERAL LIGAMENT REPAIR Left 11/15/2014   Procedure: LEFT THUMB ULNAR COLLATERAL LIGAMENT REPAIR AND OR RECONTSTRUCTION;  Surgeon: Bradly Bienenstock, MD;  Location: [facility] OR;  Service: Orthopedics;   Laterality: Left;    OB History    No data available       Home Medications    Prior to Admission medications   Medication Sig Start Date End Date Taking? Authorizing Provider  Atorvastatin Calcium (LIPITOR PO) Take by mouth.    [provider]  omeprazole (PRILOSEC) 20 MG capsule Take 1 capsule (20 mg total) by mouth 2 (two) times daily before a meal. 11/16/16   Lavera Guise, MD    Family History No family history on file.  Social History Social History  Substance Use Topics  . Smoking status: Never Smoker  . Smokeless tobacco: Never Used  . Alcohol use Yes     Comment: occasionally     Allergies   Hydromorphone hcl; Latex; Vancomycin; Dilaudid [hydromorphone hcl]; Flagyl [metronidazole]; and Zithromax [azithromycin]   Review of Systems Review of Systems  Constitutional: Negative for fever.  Respiratory: Negative for shortness of breath.   Cardiovascular: Negative for chest pain.  Gastrointestinal: Positive for abdominal pain.  Genitourinary: Negative for dysuria, vaginal bleeding and vaginal discharge.  All other systems reviewed and are negative.    Physical Exam Updated Vital Signs BP 129/80 (BP Location: Left Arm)   Pulse 70   Temp 98.4 F (36.9 C) (Oral)   Resp 18   Ht  (1.676 m)   Wt 84.1 kg (185 lb 6.5 oz)   LMP 11/07/2016   SpO2 95%   BMI 29.93 kg/m   Physical Exam Physical Exam  Nursing note and vitals reviewed. Constitutional: Well developed, well nourished, non-toxic, and in no acute distress Head: Normocephalic and atraumatic.  Mouth/Throat: Oropharynx is clear and moist.  Neck: Normal range of motion. Neck supple.  Cardiovascular: Normal rate and regular rhythm.   Pulmonary/Chest: Effort normal and breath sounds normal.  Abdominal: Soft. There is right sided abdominal tenderness. Mild Murphy's sign. Mild right CVA tenderness.There is no rebound and no guarding.  Musculoskeletal: Normal range of motion.  Neurological:  Alert, no facial droop, fluent speech, moves all extremities symmetrically Skin: Skin is warm and dry.  Psychiatric: Cooperative   ED Treatments / Results  Labs (all labs ordered are listed, but only abnormal results are displayed) Labs Reviewed  COMPREHENSIVE METABOLIC PANEL - Abnormal; Notable for the following:       Result Value   Potassium 3.2 (*)    All other components within normal limits  URINALYSIS, ROUTINE W REFLEX MICROSCOPIC - Abnormal; Notable for the following:    Hgb urine dipstick TRACE (*)    All other components within normal limits  URINALYSIS, MICROSCOPIC (REFLEX) - Abnormal; Notable for the following:    Bacteria, UA RARE (*)    Squamous Epithelial / LPF 0-5 (*)    All other components within normal limits  CBC WITH DIFFERENTIAL/PLATELET  LIPASE, BLOOD  PREGNANCY, URINE    EKG  EKG Interpretation None       Radiology Ct Renal Stone Study  Result Date: 11/16/2016 CLINICAL DATA:  Right flank pain for 2 days. EXAM: CT ABDOMEN AND PELVIS WITHOUT CONTRAST TECHNIQUE: Multidetector CT imaging of the abdomen and pelvis was performed following the standard protocol without IV contrast. COMPARISON:  March 20, 2016 FINDINGS: Lower chest: No acute abnormality. Hepatobiliary: There is a 1.8 cm low-density lesion in the anterior right lobe liver unchanged compared prior exam probably a cyst. The liver is otherwise normal. The gallbladder is normal. The biliary tree is normal. Pancreas: Unremarkable. No pancreatic ductal dilatation or surrounding inflammatory changes. Spleen: Normal in size without focal abnormality. Adrenals/Urinary Tract: Adrenal glands are unremarkable. Kidneys are normal, without renal calculi, focal lesion, or hydronephrosis. Bladder is unremarkable. Stomach/Bowel: Stomach is within normal limits. The appendix is not seen but no inflammation is noted around cecum. No evidence of bowel wall thickening, distention, or inflammatory changes.  Vascular/Lymphatic: No significant vascular findings are present. No enlarged abdominal or pelvic lymph nodes. Reproductive: Uterus and bilateral adnexa are unremarkable. Other: Pelvic phleboliths are identified unchanged.  None. Musculoskeletal: Mild degenerative joint changes of the lower lumbar spine are noted. IMPRESSION: No nephrolithiasis or hydroureteronephrosis bilaterally. No acute abnormality identified in the abdomen and pelvis. Electronically Signed   By: Sherian Rein M.D.   On: 11/16/2016 16:51   US Abdomen Limited Ruq  Result Date: 11/16/2016 CLINICAL DATA:  Right upper quadrant abdomen pain for several months. EXAM: ULTRASOUND ABDOMEN LIMITED RIGHT UPPER QUADRANT COMPARISON:  CT abdomen pelvis March 20, 2016 FINDINGS: Gallbladder: No gallstones or wall thickening visualized. No sonographic Murphy sign noted by sonographer. Common bile duct: Diameter: 3.4 mm Liver: There is diffuse increased echotexture of the liver. There is a complicated cystic lesion in the right lobe liver measuring 2.3 x 2 x 0.9 cm. This likely correlates to the low-density lesion seen on CT. Portal vein is patent on color Doppler imaging with normal direction of blood flow towards the liver. IMPRESSION: No ultrasound evidence of acute cholecystitis. Complicated cyst in the right lobe liver likely correlating to the low-density lesion seen on prior  CT of January 2018. Electronically Signed   By: Sherian Rein M.D.   On: 11/16/2016 16:20    Procedures Procedures (including critical care time)  Medications Ordered in ED Medications  ondansetron (ZOFRAN) injection 4 mg (4 mg Intravenous Given 11/16/16 1544)  morphine 4 MG/ML injection 4 mg (4 mg Intravenous Given 11/16/16 1544)     Initial Impression / Assessment and Plan / ED Course  I have reviewed the triage vital signs and the nursing notes.  Pertinent labs & imaging results that were available during my care of the patient were reviewed by me and considered  in my medical decision making (see chart for details).     Presenting with intermittent right upper quadrant abdominal pain for several months. He is well-appearing and in no acute distress. Vital signs are within normal limits. She has mild right upper quadrant abdominal pain noted on exam.  Blood work shows normal liver function testing. She is not pregnant. UA overall unremarkable. Normal lipase. Initial right upper quadrant ultrasound shows no evidence of hepatobiliary disease that could explain her symptoms. Subsequently a CT abdomen and pelvis is obtained to rule out urolithiasis versus atypical appendicitis. There is no evidence of acute intra-abdominal processes. Given reassuring workup, there is low suspicion for serious intra-abdominal processes at this time.  Given her recent significant NSAID usage, symptoms could be suggestive of gastritis versus peptic ulcer disease. She is given referral to GI for additional workup. Strict return and follow-up instructions reviewed. She expressed understanding of all discharge instructions and felt comfortable with the plan of care.   Final Clinical Impressions(s) / ED Diagnoses   Final diagnoses:  RUQ pain  RUQ abdominal pain    New Prescriptions New Prescriptions   OMEPRAZOLE (PRILOSEC) 20 MG CAPSULE    Take 1 capsule (20 mg total) by mouth 2 (two) times daily before a meal.     Lavera Guise, MD 11/16/16 1743

## 2016-11-17 ENCOUNTER — Encounter: Payer: Self-pay | Admitting: Gastroenterology

## 2016-12-28 ENCOUNTER — Emergency Department (HOSPITAL_BASED_OUTPATIENT_CLINIC_OR_DEPARTMENT_OTHER): Payer: 59

## 2016-12-28 ENCOUNTER — Emergency Department (HOSPITAL_BASED_OUTPATIENT_CLINIC_OR_DEPARTMENT_OTHER)
Admission: EM | Admit: 2016-12-28 | Discharge: 2016-12-28 | Disposition: A | Discharge status: Home / Self Care | Payer: 59 | Attending: Emergency Medicine | Admitting: Emergency Medicine

## 2016-12-28 ENCOUNTER — Encounter (HOSPITAL_BASED_OUTPATIENT_CLINIC_OR_DEPARTMENT_OTHER): Payer: Self-pay | Admitting: Emergency Medicine

## 2016-12-28 DIAGNOSIS — Z79899 Other long term (current) drug therapy: Secondary | ICD-10-CM | POA: Insufficient documentation

## 2016-12-28 DIAGNOSIS — Z91040 Latex allergy status: Secondary | ICD-10-CM | POA: Insufficient documentation

## 2016-12-28 DIAGNOSIS — I10 Essential (primary) hypertension: Secondary | ICD-10-CM | POA: Diagnosis not present

## 2016-12-28 DIAGNOSIS — K5900 Constipation, unspecified: Secondary | ICD-10-CM | POA: Diagnosis not present

## 2016-12-28 DIAGNOSIS — R1084 Generalized abdominal pain: Secondary | ICD-10-CM | POA: Diagnosis present

## 2016-12-28 MED ORDER — FLEET ENEMA 7-19 GM/118ML RE ENEM
1.0000 | ENEMA | Freq: Once | RECTAL | Status: AC
  Administered 2016-12-28: 1 via RECTAL
  Filled 2016-12-28: qty 1

## 2016-12-28 NOTE — ED Provider Notes (Signed)
[facility] EMERGENCY DEPARTMENT Provider Note   CSN: [id number] Arrival date & time: 12/28/16  2130     History   Chief Complaint Chief Complaint  Patient presents with  . Constipation    HPI Barbara Summers is a 36 y.o. female who presents to the emergency department with a chief complaint of constipation with mild generalized abdominal pain and distension, and mild, non-radiating right-sided low back pain.  She reports her last bowel movement was 5 days ago with small, pellet-like stool.  Her last soft, normal bowel movement was 9 days ago.  She also complains of mild, generalized abdominal discomfort and distention. No GU symptoms. She is able to pass flatus.  She denies fever, chills, nausea, diarrhea, or emesis.  Surgical history includes cesarean section x4.  The history is provided by the patient. No language interpreter was used.  Constipation   This is a new problem. The stool is described as pellet like. Associated symptoms include abdominal pain and flatus. Pertinent negatives include no dysuria. There has been adequate water intake. She has tried osmotic agents for the symptoms. The treatment provided no relief. Risk factors: She was ill two weeks ago with strep throat and recently finished a course of amoxicillin. Her past medical history does not include endocrine disease, irritable bowel syndrome, neurologic disease or neuromuscular disease.    Past Medical History:  Diagnosis Date  . History of bronchitis a yr ago  . History of migraine last one a month ago  . Hypertension   . Kidney stone   . PONV (postoperative nausea and vomiting)     Patient Active Problem List   Diagnosis Date Noted  . SHOULDER PAIN, LEFT 12/05/2009  . ADHESIVE CAPSULITIS, LEFT 12/05/2009    Past Surgical History:  Procedure Laterality Date  . CESAREAN SECTION    . ECTOPIC PREGNANCY SURGERY    . KNEE ARTHROSCOPY    . TONSILLECTOMY    . TUBAL LIGATION    . ULNAR COLLATERAL  LIGAMENT REPAIR Left 11/15/2014   Procedure: LEFT THUMB ULNAR COLLATERAL LIGAMENT REPAIR AND OR RECONTSTRUCTION;  Surgeon: Bradly Bienenstock, MD;  Location: [facility] OR;  Service: Orthopedics;  Laterality: Left;    OB History    No data available       Home Medications    Prior to Admission medications   Medication Sig Start Date End Date Taking? Authorizing Provider  Atorvastatin Calcium (LIPITOR PO) Take by mouth.    [provider]  omeprazole (PRILOSEC) 20 MG capsule Take 1 capsule (20 mg total) by mouth 2 (two) times daily before a meal. 11/16/16   Lavera Guise, MD    Family History History reviewed. No pertinent family history.  Social History Social History  Substance Use Topics  . Smoking status: Never Smoker  . Smokeless tobacco: Never Used  . Alcohol use Yes     Comment: occasionally     Allergies   Hydromorphone hcl; Latex; Vancomycin; Dilaudid [hydromorphone hcl]; Flagyl [metronidazole]; and Zithromax [azithromycin]   Review of Systems Review of Systems  Constitutional: Negative for chills and fever.  Respiratory: Negative for shortness of breath.   Cardiovascular: Negative for chest pain.  Gastrointestinal: Positive for abdominal distention, abdominal pain, constipation and flatus. Negative for anal bleeding, blood in stool, diarrhea, nausea and vomiting.  Genitourinary: Negative for dysuria, flank pain, frequency, urgency, vaginal bleeding, vaginal discharge and vaginal pain.  Musculoskeletal: Positive for back pain. Negative for myalgias.  Allergic/Immunologic: Negative for immunocompromised state.  Physical Exam Updated Vital Signs BP 136/75   Pulse 90   Temp 98.8 F (37.1 C) (Oral)   Resp 18   Ht 5\' 6"  (1.676 m)   Wt 81.6 kg (180 lb)   LMP 12/06/2016 (Exact Date)   SpO2 98%   BMI 29.05 kg/m   Physical Exam  Constitutional: She appears well-developed and well-nourished. No distress.  HENT:  Head: Normocephalic.  Eyes: Conjunctivae are  normal.  Neck: Neck supple.  Cardiovascular: Normal rate, regular rhythm and normal heart sounds.  Exam reveals no gallop and no friction rub.   No murmur heard. Pulmonary/Chest: Effort normal and breath sounds normal. No respiratory distress. She has no wheezes. She has no rales.  Abdominal: Soft. Bowel sounds are normal. She exhibits no distension and no mass. There is tenderness. There is no rebound and no guarding. No hernia.  Normoactive bowel sounds in all 4 quadrants.  Mild, diffuse generalized abdominal tenderness to palpation.  Negative Murphy sign.  No tenderness over McBurney's point.  No CVA tenderness bilaterally.  No peritoneal signs.  Neurological: She is alert.  Skin: Skin is warm. No rash noted. She is not diaphoretic.  Psychiatric: Her behavior is normal.  Nursing note and vitals reviewed.    ED Treatments / Results  Labs (all labs ordered are listed, but only abnormal results are displayed) Labs Reviewed - No data to display  EKG  EKG Interpretation None       Radiology Dg Abdomen 1 View  Result Date: 12/28/2016 CLINICAL DATA:  Constipation for 4 days EXAM: ABDOMEN - 1 VIEW COMPARISON:  CT abdomen and pelvis November 16, 2016 FINDINGS: There is diffuse stool throughout colon. There is no bowel dilatation or air-fluid level to suggest bowel obstruction. No free air. Calcifications in the pelvis are likely due to phleboliths. Lung bases are clear. IMPRESSION: Diffuse stool throughout colon, likely due to constipation. No bowel obstruction or free air evident. Lung bases clear. Electronically Signed   By: Bretta BangWilliam  Woodruff III M.D.   On: 12/28/2016 11:39    Procedures Procedures (including critical care time)  Medications Ordered in ED Medications  sodium phosphate (FLEET) 7-19 GM/118ML enema 1 enema (1 enema Rectal Given 12/28/16 1138)     Initial Impression / Assessment and Plan / ED Course  I have reviewed the triage vital signs and the nursing  notes.  Pertinent labs & imaging results that were available during my care of the patient were reviewed by me and considered in my medical decision making (see chart for details).     36 year old female presenting with constipation times 5 days.  No constitutional symptoms, GU symptoms, or N/V/D.  No focal tenderness on physical exam.  Bowel sounds auscultated in all 4 quadrants.  Doubt appendicitis, cholecystitis, diverticulitis, TOA, nephrolithiasis, or other intra-abdominal pathology.  Abdominal x-ray with diffuse stool throughout the colon, likely due to constipation.  No evidence of bowel obstruction or free air.  Discussed the findings with the patient.  Will order a Fleet sodium phosphate enema in the ED.  After administration of the enema, the patient reports 2 large bowel movements.  She reports her abdominal pain has almost completely resolved.  Recommended the patient continue MiraLAX and start Metamucil at home.  Encouraged good p.o. fluid intake.  Strict return precautions given.  VSS. No acute distress.  The patient is safe for discharge at this time.  Final Clinical Impressions(s) / ED Diagnoses   Final diagnoses:  Constipation, unspecified constipation type  New Prescriptions Discharge Medication List as of 12/28/2016 12:30 PM       Frederik Pear A, PA-C 12/30/16 0345    Alvira Monday, MD 12/31/16 1258

## 2016-12-28 NOTE — ED Triage Notes (Signed)
Patient reports no normal bowel movement x 4-5 days.  Reports "extremely small bowel movements".  States she tried chocolate laxatives and miralax without relief.

## 2016-12-28 NOTE — Discharge Instructions (Signed)
Continue to take MiraLAX as directed until you have a soft bowel movement.  Then, cut the MiraLAX dosing in half. You can stop taking this medication when you bowel movements are continually soft.  If you continue to take it, your stools can become too loose and you can end up having diarrhea.  Please continue to drink plenty of water.  Metamucil is available over-the-counter and can be mixed with water to increase your daily fiber intake.  Increasing her water and fiber will naturally help to bulk up your stools.  If you develop new or worsening symptoms including fever, chills, blood in your stool, or if you are unable to have a bowel movement after several days or pass gas, please return to the emergency department for reevaluation.  If you develop constipation again, but are still able to pass gas, please follow-up with your primary care provider for reevaluation.

## 2017-01-07 ENCOUNTER — Encounter: Payer: Self-pay | Admitting: Gastroenterology

## 2017-01-07 ENCOUNTER — Ambulatory Visit: Payer: 59 | Admitting: Gastroenterology

## 2017-01-07 VITALS — BP 130/82 | HR 76 | Ht 66.0 in | Wt 192.8 lb

## 2017-01-07 DIAGNOSIS — R0789 Other chest pain: Secondary | ICD-10-CM | POA: Diagnosis not present

## 2017-01-07 DIAGNOSIS — K5900 Constipation, unspecified: Secondary | ICD-10-CM

## 2017-01-07 DIAGNOSIS — R1011 Right upper quadrant pain: Secondary | ICD-10-CM

## 2017-01-07 DIAGNOSIS — G8929 Other chronic pain: Secondary | ICD-10-CM | POA: Diagnosis not present

## 2017-01-07 DIAGNOSIS — R1013 Epigastric pain: Secondary | ICD-10-CM

## 2017-01-07 NOTE — Patient Instructions (Signed)
If you are age 36 or older, your body mass index should be between 23-30. Your Body mass index is 31.12 kg/m. If this is out of the aforementioned range listed, please consider follow up with your Primary Care Provider.  If you are age 64 or younger, your body mass index should be between 19-25. Your Body mass index is 31.12 kg/m. If this is out of the aformentioned range listed, please consider follow up with your Primary Care Provider.   You have been scheduled for a colonoscopy. Please follow written instructions given to you at your visit today.  Please pick up your prep supplies at the pharmacy within the next 1-3 days. If you use inhalers (even only as needed), please bring them with you on the day of your procedure. Your physician has requested that you go to www.startemmi.com and enter the access code given to you at your visit today. This web site gives a general overview about your procedure. However, you should still follow specific instructions given to you by our office regarding your preparation for the procedure.  Thank you for choosing [facility]  Dr Henry Danis III  

## 2017-01-07 NOTE — Progress Notes (Signed)
[facility] Consult Note:  History: Barbara Summers 01/07/2017  Referring physician: Guido Summers Barbara King, PA  Reason for consult/chief complaint: Chest Pain (since April; frequently; seems to be esophageal; does not feel regurgitation or reflux; ); Abdominal Pain (RUQ abdominal pain; sometimes tender to touch; can be worse after eating; + bloating); and Constipation (can go for up to 2 weeks without having bowel movement; has had to take colonoscopy prep to have bowel movement; has recently started dulcolax but this is not helping)   Subjective  HPI:  This is a 36 year old woman referred by primary care noted above for chest and abdominal pain. Her problem dates back to early this year, when she was having some intermittent abdominal pain she cannot quite recall the details of. It seems that things really took off in April of this year, when she began having intermittent episodes of sharp nonexertional central and left-sided chest pain. She came to the ED on multiple occasions, and eventually saw cardiology and reports having had a negative stress test. During an ED visit I do see that she had a normal EKG and negative troponin. Cardiology only discovered that her lipids were elevated, and she was placed on meds for that. She is still having these episodes of chest pain but sometimes it is also in the right chest wall or down the right side of the abdomen. It is no clear triggers or relieving factors such as meal, position or time of day. She was told it might be related to acid reflux, although she denies having had pyrosis or regurgitation. She has been on twice a day PPI for over a month now with no improvement. She does note that on at least one occasion her blood pressure was quite high during an episode of this chest pain.  Lately, she has had worsened constipation. For many years she has tended to go 2 or 3 times a week, but most recently had no BM for about 2 weeks and  required a GoLYTELY prescription from her PCP last week for relief. She took about half of it and had significant evacuation.  Barbara Summers denies rectal bleeding, nausea vomiting, early satiety or weight loss. In fact, she finds it frustrating that she cannot lose weight and wonders if symptoms are related.  ROS:  Review of Systems  Constitutional: Negative for appetite change and unexpected weight change.  HENT: Negative for mouth sores and voice change.   Eyes: Negative for pain and redness.  Respiratory: Negative for cough and shortness of breath.   Cardiovascular: Negative for chest pain and palpitations.  Genitourinary: Negative for dysuria and hematuria.  Musculoskeletal: Negative for arthralgias and myalgias.  Skin: Negative for pallor and rash.  Neurological: Positive for headaches. Negative for weakness.  Hematological: Negative for adenopathy.     Past Medical History: Past Medical History:  Diagnosis Date  . History of bronchitis a yr ago  . History of migraine last one a month ago  . Hypertension   . Kidney stone   . PONV (postoperative nausea and vomiting)      Past Surgical History: Past Surgical History:  Procedure Laterality Date  . CESAREAN SECTION     x 4  . ECTOPIC PREGNANCY SURGERY    . KNEE ARTHROSCOPY Left   . TONSILLECTOMY    . TUBAL LIGATION       Family History: Family History  Problem Relation Age of Onset  . Heart disease Father   . Heart disease Maternal Grandmother   .  Heart disease Paternal Grandfather   . Heart disease Maternal Aunt   . Colon cancer Neg Hx   . Esophageal cancer Neg Hx   . Pancreatic cancer Neg Hx   . Stomach cancer Neg Hx     Social History: Social History   Socioeconomic History  . Marital status: Married    Spouse name: None  . Number of children: None  . Years of education: None  . Highest education level: None  Social Needs  . Financial resource strain: None  . Food insecurity - worry: None  . Food  insecurity - inability: None  . Transportation needs - medical: None  . Transportation needs - non-medical: None  Occupational History  . None  Tobacco Use  . Smoking status: Never Smoker  . Smokeless tobacco: Never Used  Substance and Sexual Activity  . Alcohol use: Yes    Comment: occasionally  . Drug use: No  . Sexual activity: Yes    Birth control/protection: Surgical  Other Topics Concern  . None  Social History Narrative  . None    Allergies: Allergies  Allergen Reactions  . Hydromorphone Hcl Nausea Only and Other (See Comments)    Hallucinations  . Latex Anaphylaxis, Shortness Of Breath and Rash  . Vancomycin Hives and Other (See Comments)    Blisters  . Dilaudid [Hydromorphone Hcl]   . Tramadol     Headache, vomiting   . Flagyl [Metronidazole] Other (See Comments)    Unknown - Childhood allergy  . Zithromax [Azithromycin] Nausea Only    Outpatient Meds: Current Outpatient Medications  Medication Sig Dispense Refill  . Atorvastatin Calcium (LIPITOR PO) Take 80 mg daily by mouth.     Marland Kitchen. omeprazole (PRILOSEC) 20 MG capsule Take 1 capsule (20 mg total) by mouth 2 (two) times daily before a meal. 60 capsule 0   No current facility-administered medications for this visit.       ___________________________________________________________________ Objective   Exam:  BP 130/82   Pulse 76   Ht 5\' 6"  (1.676 m)   Wt 192 lb 12.8 oz (87.5 kg)   BMI 31.12 kg/m    General: this is a(n) overweight and well-appearing woman   Eyes: sclera anicteric, no redness  ENT: oral mucosa moist without lesions, no cervical or supraclavicular lymphadenopathy, good dentition  CV: RRR without murmur, S1/S2, no JVD, no peripheral edema  Resp: clear to auscultation bilaterally, normal RR and effort noted  GI: soft, mild epigastric and right upper quadrant and right lower quadrant tenderness tenderness, with active bowel sounds. No guarding or palpable organomegaly  noted.  No back or chest wall tenderness  Skin; warm and dry, no rash or jaundice noted  Neuro: awake, alert and oriented x 3. Normal gross motor function and fluent speech  Labs:  CMP Latest Ref Rng & Units 11/16/2016 08/27/2016 06/23/2016  Glucose 65 - 99 mg/dL 70 86 81  BUN 6 - 20 mg/dL 9 8 6   Creatinine 0.44 - 1.00 mg/dL 4.090.69 8.110.70 9.140.56  Sodium 135 - 145 mmol/L 140 137 136  Potassium 3.5 - 5.1 mmol/L 3.2(L) 3.8 3.2(L)  Chloride 101 - 111 mmol/L 106 105 104  CO2 22 - 32 mmol/L 29 24 23   Calcium 8.9 - 10.3 mg/dL 9.2 9.3 9.2  Total Protein 6.5 - 8.1 g/dL 6.9 - -  Total Bilirubin 0.3 - 1.2 mg/dL 0.8 - -  Alkaline Phos 38 - 126 U/L 70 - -  AST 15 - 41 U/L 21 - -  ALT 14 - 54 U/L 32 - -   CBC Latest Ref Rng & Units 11/16/2016 08/27/2016 06/23/2016  WBC 4.0 - 10.5 K/uL 10.4 11.2(H) 11.4(H)  Hemoglobin 12.0 - 15.0 g/dL 16.1 09.6 04.5  Hematocrit 36.0 - 46.0 % 38.7 41.9 42.0  Platelets 150 - 400 K/uL 274 277 296     Radiologic Studies:  [place] RUQ 11/16/16:  Complex cysts right lobe liver CT urogram same day negative  CTAP with contrast 03/07/16:  2mm UVJ stone, o/w negfor acute findings   ED for CP 6/29/8, neg EKG and troponin, no improvement with GI cocktail Located in care everywhere is a cardiac catheterization report from 09/24/2016, with the patient had normal coronary arteries.  Assessment: Encounter Diagnoses  Name Primary?  . Non-cardiac chest pain   . Abdominal pain, chronic, epigastric Yes  . RUQ pain   . Constipation, unspecified constipation type     The patient's symptoms are difficult to characterize and have not followed atypical or consistent pattern for most common digestive conditions. It certainly has not improved after a sufficient trial of PPI, speaking against reflux, so I have asked her to stop that. It is atypical for gallbladder in its variable location and lack of consistent relation to meals. It is also somewhat uncertain how her recent worsening of  constipation is related to this.  Plan:  Twice daily MiraLAX her constipation, decreased to once daily if stool to loose or frequent. Stop omeprazole Upper endoscopy and colonoscopy She is agreeable after discussion of procedure and risks. The benefits and risks of the planned procedure were described in detail with the patient or (when appropriate) their health care proxy.  Risks were outlined as including, but not limited to, bleeding, infection, perforation, adverse medication reaction leading to cardiac or pulmonary decompensation, or pancreatitis (if ERCP).  The limitation of incomplete mucosal visualization was also discussed.  No guarantees or warranties were given.  Thank you for the courtesy of this consult.  Please call me with any questions or concerns.  Charlie Pitter III  CC: Junie Bame, [place]

## 2017-01-12 ENCOUNTER — Telehealth: Payer: Self-pay | Admitting: Gastroenterology

## 2017-01-12 NOTE — Telephone Encounter (Signed)
Patient saw her PCP, was discussing RUQ pain again with her. She has stopped her PPI, pain is still the same. She has noticed that it is worse when she eats red meat. She would like to have a consult with a surgeon to discuss gallbladder removal. PCP states that she could also help with this referral, if needed.

## 2017-01-12 NOTE — Telephone Encounter (Signed)
Left message for patient to call back, she is currently in a doctor appointment.

## 2017-01-13 NOTE — Telephone Encounter (Signed)
Spoke to patient let her know that we will send over a referral to [facility] for RUQ pain. Faxed to [facility] at [phone].

## 2017-01-13 NOTE — Telephone Encounter (Signed)
Thanks for the note.  She and I discussed that the symptoms are not typical for gallbladder.  But if she would like to consult with a surgeon, that is fine with me.  I do not have a preference for a particular surgeon at [facility].  Referral can be made for RUQ pain.

## 2017-01-19 ENCOUNTER — Telehealth: Payer: Self-pay

## 2017-01-19 NOTE — Telephone Encounter (Signed)
Patient has appointment with Dr. Fredricka Bonineonnor on 01/29/17 at 1:30 pm at [facility].

## 2017-01-28 ENCOUNTER — Encounter: Payer: Self-pay | Admitting: Gastroenterology

## 2017-02-03 ENCOUNTER — Encounter: Payer: Self-pay | Admitting: Gastroenterology

## 2017-02-03 ENCOUNTER — Ambulatory Visit (AMBULATORY_SURGERY_CENTER): Payer: 59 | Admitting: Gastroenterology

## 2017-02-03 ENCOUNTER — Other Ambulatory Visit: Payer: Self-pay

## 2017-02-03 VITALS — BP 120/54 | HR 63 | Temp 97.5°F | Resp 18 | Ht 66.0 in | Wt 192.0 lb

## 2017-02-03 DIAGNOSIS — K5900 Constipation, unspecified: Secondary | ICD-10-CM

## 2017-02-03 DIAGNOSIS — R0789 Other chest pain: Secondary | ICD-10-CM

## 2017-02-03 DIAGNOSIS — R1010 Upper abdominal pain, unspecified: Secondary | ICD-10-CM

## 2017-02-03 DIAGNOSIS — R1013 Epigastric pain: Secondary | ICD-10-CM

## 2017-02-03 DIAGNOSIS — R140 Abdominal distension (gaseous): Secondary | ICD-10-CM | POA: Diagnosis not present

## 2017-02-03 MED ORDER — SODIUM CHLORIDE 0.9 % IV SOLN: 500.0000 mL | Freq: Once | INTRAVENOUS | Status: DC

## 2017-02-03 MED ORDER — LINACLOTIDE 145 MCG PO CAPS: 145.0000 ug | ORAL_CAPSULE | Freq: Every day | ORAL | 1 refills | Status: DC

## 2017-02-03 NOTE — Progress Notes (Signed)
No problems noted in the recovery room. Maw  Work note was given to pt for today. maw

## 2017-02-03 NOTE — Op Note (Signed)
[facility] Patient Name: Barbara FordKimbely Hyle Procedure Date: 02/03/2017 4:17 PM MRN: [id number] Endoscopist: Sherilyn CooterHenry L. Myrtie Neitheranis , MD Age: [age] Referring MD:  Date of Birth: 1980-08-25 Gender: Female Account #: [id number] Procedure:                Upper GI endoscopy Indications:              Upper abdominal pain, Unexplained chest pain Medicines:                Monitored Anesthesia Care Procedure:                Pre-Anesthesia Assessment:                           - Prior to the procedure, a History and Physical                            was performed, and patient medications and                            allergies were reviewed. The patient's tolerance of                            previous anesthesia was also reviewed. The risks                            and benefits of the procedure and the sedation                            options and risks were discussed with the patient.                            All questions were answered, and informed consent                            was obtained. Prior Anticoagulants: The patient has                            taken no previous anticoagulant or antiplatelet                            agents. ASA Grade Assessment: II - A patient with                            mild systemic disease. After reviewing the risks                            and benefits, the patient was deemed in                            satisfactory condition to undergo the procedure.                           After obtaining informed consent, the endoscope was  passed under direct vision. Throughout the                            procedure, the patient's blood pressure, pulse, and                            oxygen saturations were monitored continuously. The                            Model [id number](SN#[id number]) scope was introduced                            through the mouth, and advanced to the second part                            of  duodenum. The upper GI endoscopy was                            accomplished without difficulty. The patient                            tolerated the procedure well. Scope In: Scope Out: Findings:                 The larynx was normal.                           The esophagus was normal.                           The stomach was normal.                           The cardia and gastric fundus were normal on                            retroflexion.                           The examined duodenum was normal. Complications:            No immediate complications. Estimated Blood Loss:     Estimated blood loss: none. Impression:               - Normal larynx.                           - Normal esophagus.                           - Normal stomach.                           - Normal examined duodenum.                           - No specimens collected. Recommendation:           - Patient has a contact number available for  emergencies. The signs and symptoms of potential                            delayed complications were discussed with the                            patient. Return to normal activities tomorrow.                            Written discharge instructions were provided to the                            patient.                           - Resume previous diet.                           - Continue present medications.                           - See the other procedure note for documentation of                            additional recommendations. Codey Burling L. Myrtie Neither, MD 02/03/2017 4:59:25 PM This report has been signed electronically.

## 2017-02-03 NOTE — Patient Instructions (Signed)
YOU HAD AN ENDOSCOPIC PROCEDURE TODAY AT THE [facility]:   Refer to the procedure report that was given to you for any specific questions about what was found during the examination.  If the procedure report does not answer your questions, please call your gastroenterologist to clarify.  If you requested that your care partner not be given the details of your procedure findings, then the procedure report has been included in a sealed envelope for you to review at your convenience later.  YOU SHOULD EXPECT: Some feelings of bloating in the abdomen. Passage of more gas than usual.  Walking can help get rid of the air that was put into your GI tract during the procedure and reduce the bloating. If you had a lower endoscopy (such as a colonoscopy or flexible sigmoidoscopy) you may notice spotting of blood in your stool or on the toilet paper. If you underwent a bowel prep for your procedure, you may not have a normal bowel movement for a few days.  Please Note:  You might notice some irritation and congestion in your nose or some drainage.  This is from the oxygen used during your procedure.  There is no need for concern and it should clear up in a day or so.  SYMPTOMS TO REPORT IMMEDIATELY:   Following lower endoscopy (colonoscopy or flexible sigmoidoscopy):  Excessive amounts of blood in the stool  Significant tenderness or worsening of abdominal pains  Swelling of the abdomen that is new, acute  Fever of 100F or higher   Following upper endoscopy (EGD)  Vomiting of blood or coffee ground material  New chest pain or pain under the shoulder blades  Painful or persistently difficult swallowing  New shortness of breath  Fever of 100F or higher  Black, tarry-looking stools  For urgent or emergent issues, a gastroenterologist can be reached at any hour by calling [phone].   DIET:  We do recommend a small meal at first, but then you may proceed to your regular diet.  Drink  plenty of fluids but you should avoid alcoholic beverages for 24 hours.  ACTIVITY:  You should plan to take it easy for the rest of today and you should NOT DRIVE or use heavy machinery until tomorrow (because of the sedation medicines used during the test).    FOLLOW UP: Our staff will call the number listed on your records the next business day following your procedure to check on you and address any questions or concerns that you may have regarding the information given to you following your procedure. If we do not reach you, we will leave a message.  However, if you are feeling well and you are not experiencing any problems, there is no need to return our call.  We will assume that you have returned to your regular daily activities without incident.  If any biopsies were taken you will be contacted by phone or by letter within the next 1-3 weeks.  Please call us at [phone] if you have not heard about the biopsies in 3 weeks.    SIGNATURES/CONFIDENTIALITY: You and/or your care partner have signed paperwork which will be entered into your electronic medical record.  These signatures attest to the fact that that the information above on your After Visit Summary has been reviewed and is understood.  Full responsibility of the confidentiality of this discharge information lies with you and/or your care-partner.     Handout was given to your husband on Irritable  Bowel Syndrome. You may resume your current medications today. Use LINZESS (linaclotide) 145 mcg by mouth once daily for 2 weeks.  Rx was sent to your pharmacy. Return to my office at appointment to be scheduled.  Dr. Irving Burtonanis's nurse will call you with a follow up appointment. Please call if any questions or concerns.

## 2017-02-03 NOTE — Progress Notes (Signed)
No problems noted in the recovery room. maw 

## 2017-02-03 NOTE — Op Note (Signed)
[facility] Patient Name: Barbara Summers Procedure Date: 02/03/2017 4:17 PM MRN: [id number] Endoscopist: Sherilyn Cooter L. Myrtie Neither , MD Age: 36 Referring MD:  Date of Birth: [birth year] Gender: Female Account #: [id number] Procedure:                Colonoscopy Indications:              Constipation, bloating Medicines:                Monitored Anesthesia Care Procedure:                Pre-Anesthesia Assessment:                           - Prior to the procedure, a History and Physical                            was performed, and patient medications and                            allergies were reviewed. The patient's tolerance of                            previous anesthesia was also reviewed. The risks                            and benefits of the procedure and the sedation                            options and risks were discussed with the patient.                            All questions were answered, and informed consent                            was obtained. Prior Anticoagulants: The patient has                            taken no previous anticoagulant or antiplatelet                            agents. ASA Grade Assessment: II - A patient with                            mild systemic disease. After reviewing the risks                            and benefits, the patient was deemed in                            satisfactory condition to undergo the procedure.                           After obtaining informed consent, the colonoscope  was passed under direct vision. Throughout the                            procedure, the patient's blood pressure, pulse, and                            oxygen saturations were monitored continuously. The                            Colonoscope was introduced through the anus and                            advanced to the the terminal ileum. The colonoscopy                            was performed without difficulty. The  patient                            tolerated the procedure well. The quality of the                            bowel preparation was excellent. The ileocecal                            valve, appendiceal orifice, and rectum were                            photographed. The quality of the bowel preparation                            was evaluated using the BBPS [facility]([place] Bowel                            Preparation Scale) with scores of: Right Colon = 3,                            Transverse Colon = 3 and Left Colon = 3 (entire                            mucosa seen well with no residual staining, small                            fragments of stool or opaque liquid). The total                            BBPS score equals 9. The bowel preparation used was                            SUPREP. Scope In: 4:31:18 PM Scope Out: 4:43:41 PM Scope Withdrawal Time: 0 hours 7 minutes 28 seconds  Total Procedure Duration: 0 hours 12 minutes 23 seconds  Findings:                 The perianal  and digital rectal examinations were                            normal.                           The terminal ileum appeared normal.                           The entire examined colon appeared normal on direct                            and retroflexion views. Complications:            No immediate complications. Estimated Blood Loss:     Estimated blood loss: none. Impression:               - The examined portion of the ileum was normal.                           - The entire examined colon is normal on direct and                            retroflexion views.                           - No specimens collected. Recommendation:           - Patient has a contact number available for                            emergencies. The signs and symptoms of potential                            delayed complications were discussed with the                            patient. Return to normal activities tomorrow.                             Written discharge instructions were provided to the                            patient.                           - Resume previous diet.                           - Continue present medications.                           - No recommendation at this time regarding repeat                            colonoscopy due to age.                           -  Use Linzess (linaclotide) 145 mcg PO daily for 2                            weeks.                           - Return to my office at appointment to be                            scheduled. Neelam Tiggs L. Myrtie Neitheranis, MD 02/03/2017 5:04:33 PM This report has been signed electronically.

## 2017-02-04 ENCOUNTER — Telehealth: Payer: Self-pay | Admitting: *Deleted

## 2017-02-04 NOTE — Telephone Encounter (Signed)
  Follow up Call-  Call back number 02/03/2017  Post procedure Call Back phone  # [phone]  Permission to leave phone message Yes  Some recent data might be hidden    No answer, left message to call if questions or concerns.

## 2017-02-19 ENCOUNTER — Telehealth: Payer: Self-pay

## 2017-02-19 NOTE — Telephone Encounter (Signed)
Received a prior auth request from [facility] for Linzess 145mcg #14.  Left a voicemail for the patient to return call to see if Dr Myrtie Neitheranis was just giving her a 2 week trial of Linzess. If so we can give her the 2 week trial of samples to see if she would like to continue a full prescription and start the prior auth at that time.

## 2017-02-25 NOTE — Telephone Encounter (Signed)
Called and LM for pt to call back.

## 2017-03-18 ENCOUNTER — Ambulatory Visit: Payer: Self-pay | Admitting: Gastroenterology

## 2017-03-18 ENCOUNTER — Other Ambulatory Visit: Payer: Self-pay

## 2017-04-16 ENCOUNTER — Encounter (HOSPITAL_BASED_OUTPATIENT_CLINIC_OR_DEPARTMENT_OTHER): Payer: Self-pay | Admitting: Emergency Medicine

## 2017-04-16 ENCOUNTER — Other Ambulatory Visit: Payer: Self-pay

## 2017-04-16 ENCOUNTER — Emergency Department (HOSPITAL_BASED_OUTPATIENT_CLINIC_OR_DEPARTMENT_OTHER)
Admission: EM | Admit: 2017-04-16 | Discharge: 2017-04-16 | Disposition: A | Discharge status: Home / Self Care | Payer: 59 | Attending: Emergency Medicine | Admitting: Emergency Medicine

## 2017-04-16 DIAGNOSIS — Z79899 Other long term (current) drug therapy: Secondary | ICD-10-CM | POA: Insufficient documentation

## 2017-04-16 DIAGNOSIS — Z91040 Latex allergy status: Secondary | ICD-10-CM | POA: Insufficient documentation

## 2017-04-16 DIAGNOSIS — E785 Hyperlipidemia, unspecified: Secondary | ICD-10-CM | POA: Insufficient documentation

## 2017-04-16 DIAGNOSIS — I10 Essential (primary) hypertension: Secondary | ICD-10-CM | POA: Diagnosis not present

## 2017-04-16 DIAGNOSIS — K529 Noninfective gastroenteritis and colitis, unspecified: Secondary | ICD-10-CM | POA: Diagnosis not present

## 2017-04-16 DIAGNOSIS — R1110 Vomiting, unspecified: Secondary | ICD-10-CM | POA: Diagnosis present

## 2017-04-16 LAB — CBC
HCT: 39.1 % (ref 36.0–46.0)
Hemoglobin: 13.3 g/dL (ref 12.0–15.0)
MCH: 30.6 pg (ref 26.0–34.0)
MCHC: 34 g/dL (ref 30.0–36.0)
MCV: 90.1 fL (ref 78.0–100.0)
PLATELETS: 288 10*3/uL (ref 150–400)
RBC: 4.34 MIL/uL (ref 3.87–5.11)
RDW: 13.4 % (ref 11.5–15.5)
WBC: 12 10*3/uL — AB (ref 4.0–10.5)

## 2017-04-16 LAB — COMPREHENSIVE METABOLIC PANEL
ALT: 40 U/L (ref 14–54)
AST: 25 U/L (ref 15–41)
Albumin: 3.8 g/dL (ref 3.5–5.0)
Alkaline Phosphatase: 91 U/L (ref 38–126)
Anion gap: 6 (ref 5–15)
BUN: 8 mg/dL (ref 6–20)
CHLORIDE: 107 mmol/L (ref 101–111)
CO2: 25 mmol/L (ref 22–32)
CREATININE: 0.71 mg/dL (ref 0.44–1.00)
Calcium: 8.7 mg/dL — ABNORMAL LOW (ref 8.9–10.3)
GFR calc Af Amer: >60 mL/min (ref >60)
Glucose, Bld: 86 mg/dL (ref 65–99)
Potassium: 3.8 mmol/L (ref 3.5–5.1)
Sodium: 138 mmol/L (ref 135–145)
TOTAL PROTEIN: 6.9 g/dL (ref 6.5–8.1)
Total Bilirubin: 0.9 mg/dL (ref 0.3–1.2)

## 2017-04-16 LAB — LIPASE, BLOOD: LIPASE: 38 U/L (ref 11–51)

## 2017-04-16 MED ORDER — ONDANSETRON HCL 4 MG/2ML IJ SOLN
4.0000 mg | Freq: Once | INTRAMUSCULAR | Status: AC | PRN
  Administered 2017-04-16: 4 mg via INTRAVENOUS
  Filled 2017-04-16: qty 2

## 2017-04-16 MED ORDER — ONDANSETRON HCL 4 MG PO TABS: 4.0000 mg | ORAL_TABLET | Freq: Three times a day (TID) | ORAL | 0 refills | Status: DC | PRN

## 2017-04-16 MED ORDER — SODIUM CHLORIDE 0.9 % IV BOLUS (SEPSIS)
1000.0000 mL | Freq: Once | INTRAVENOUS | Status: AC
  Administered 2017-04-16: 1000 mL via INTRAVENOUS

## 2017-04-16 NOTE — ED Triage Notes (Signed)
Felt bad yesterday.  Sore throat last night.  Vomiting and diarrhea since 3am this morning.

## 2017-04-16 NOTE — ED Notes (Signed)
Pt unable to void at this time. Given ginger ale and crackers.

## 2017-04-16 NOTE — ED Provider Notes (Signed)
[facility] EMERGENCY DEPARTMENT Provider Note   CSN: [id number] Arrival date & time: 04/16/17  0915     History   Chief Complaint Chief Complaint  Patient presents with  . Emesis    HPI Barbara Summers is a 37 y.o. female.  The history is provided by the patient. No language interpreter was used.  Emesis     Barbara Summers is a 37 y.o. female who presents to the Emergency Department complaining of vomiting/diarrhea.  She presents for evaluation of nausea, vomiting, diarrhea that began last night.  Last night she developed a right-sided sore throat with some mild nasal congestion and slight cough.  Later in the night she developed profuse nausea, vomiting, diarrhea with numerous episodes around 2 in the morning.  She reports temperature to 99.9 at home.  No difficulty breathing or difficulty swallowing.  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  She has a history of hyperlipidemia, no additional medical problems.  No dysuria but she does endorse decreased urinary output.  She has abdominal discomfort when vomiting or having diarrhea but otherwise no pain. Past Medical History:  Diagnosis Date  . Heart murmur 08/2016  . History of bronchitis a yr ago  . History of migraine last one a month ago  . Hyperlipidemia   . Hypertension   . Kidney stone   . PONV (postoperative nausea and vomiting)     Patient Active Problem List   Diagnosis Date Noted  . SHOULDER PAIN, LEFT 12/05/2009  . ADHESIVE CAPSULITIS, LEFT 12/05/2009    Past Surgical History:  Procedure Laterality Date  . CESAREAN SECTION     x 4  . CHOLECYSTECTOMY    . ECTOPIC PREGNANCY SURGERY    . KNEE ARTHROSCOPY Left   . TONSILLECTOMY    . TUBAL LIGATION    . ULNAR COLLATERAL LIGAMENT REPAIR Left 11/15/2014   Procedure: LEFT THUMB ULNAR COLLATERAL LIGAMENT REPAIR AND OR RECONTSTRUCTION;  Surgeon: Bradly BienenstockFred Ortmann, MD;  Location: [facility] OR;  Service: Orthopedics;  Laterality: Left;    OB  History    No data available       Home Medications    Prior to Admission medications   Medication Sig Start Date End Date Taking? Authorizing Provider  Atorvastatin Calcium (LIPITOR PO) Take 80 mg daily by mouth.     [provider]  buPROPion (WELLBUTRIN) 75 MG tablet Take 75 tablets by mouth 2 (two) times daily. 01/13/17   [provider]  fenofibrate 54 MG tablet Take 54 mg by mouth daily. 01/14/17   [provider]  linaclotide Karlene Einstein(LINZESS) 145 MCG CAPS capsule Take 1 capsule (145 mcg total) by mouth daily before breakfast. 02/03/17   Danis, Andreas BlowerHenry L III, MD  ondansetron (ZOFRAN) 4 MG tablet Take 1 tablet (4 mg total) by mouth every 8 (eight) hours as needed for nausea or vomiting. 04/16/17   Tilden Fossaees, Capricia Serda, MD    Family History Family History  Problem Relation Age of Onset  . Heart disease Father   . Heart disease Maternal Grandmother   . Heart disease Paternal Grandfather   . Heart disease Maternal Aunt   . Colon polyps Maternal Aunt   . Colon cancer Neg Hx   . Esophageal cancer Neg Hx   . Pancreatic cancer Neg Hx   . Stomach cancer Neg Hx     Social History Social History   Tobacco Use  . Smoking status: Never Smoker  . Smokeless tobacco: Never  Used  Substance Use Topics  . Alcohol use: Yes    Comment: occasionally  . Drug use: No     Allergies   Hydromorphone hcl; Latex; Vancomycin; Dilaudid [hydromorphone hcl]; Tramadol; Flagyl [metronidazole]; and Zithromax [azithromycin]   Review of Systems Review of Systems  Gastrointestinal: Positive for vomiting.  All other systems reviewed and are negative.    Physical Exam Updated Vital Signs BP 128/81 (BP Location: Right Arm)   Pulse 73   Temp 98.8 F (37.1 C) (Oral)   Resp 16   Ht 5\' 6"  (1.676 m)   Wt 81.6 kg (180 lb)   LMP 04/08/2017 (Exact Date)   SpO2 100%   BMI 29.05 kg/m   Physical Exam  Constitutional: She is oriented to person, place, and time. She appears  well-developed and well-nourished.  HENT:  Head: Normocephalic and atraumatic.  Cardiovascular: Normal rate and regular rhythm.  Pulmonary/Chest: Effort normal. No respiratory distress.  Abdominal: Soft. There is no tenderness. There is no rebound and no guarding.  Musculoskeletal: She exhibits no edema or tenderness.  Neurological: She is alert and oriented to person, place, and time.  Skin: Skin is warm and dry.  Psychiatric: She has a normal mood and affect. Her behavior is normal.  Nursing note and vitals reviewed.    ED Treatments / Results  Labs (all labs ordered are listed, but only abnormal results are displayed) Labs Reviewed  COMPREHENSIVE METABOLIC PANEL - Abnormal; Notable for the following components:      Result Value   Calcium 8.7 (*)    All other components within normal limits  CBC - Abnormal; Notable for the following components:   WBC 12.0 (*)    All other components within normal limits  LIPASE, BLOOD    EKG  EKG Interpretation None       Radiology No results found.  Procedures Procedures (including critical care time)  Medications Ordered in ED Medications  ondansetron (ZOFRAN) injection 4 mg (4 mg Intravenous Given 04/16/17 1021)  sodium chloride 0.9 % bolus 1,000 mL (0 mLs Intravenous Stopped 04/16/17 1149)     Initial Impression / Assessment and Plan / ED Course  I have reviewed the triage vital signs and the nursing notes.  Pertinent labs & imaging results that were available during my care of the patient were reviewed by me and considered in my medical decision making (see chart for details).     Pt here for evaluation of N/V/D.  She is nontoxic appearing on exam with no significant tenderness.  Pt feels improved on recheck in the ED.  Presentation is not c/w appendicitis, cholecystitis, diverticulitis.   Discussed with patient home care for gastroenteritis.  Discussed outpatient follow up and return precautions.   Final Clinical  Impressions(s) / ED Diagnoses   Final diagnoses:  Gastroenteritis    ED Discharge Orders        Ordered    ondansetron (ZOFRAN) 4 MG tablet  Every 8 hours PRN     04/16/17 1136       Tilden Fossa, MD 04/16/17 1735

## 2017-05-26 ENCOUNTER — Emergency Department (HOSPITAL_BASED_OUTPATIENT_CLINIC_OR_DEPARTMENT_OTHER): Payer: 59

## 2017-05-26 ENCOUNTER — Emergency Department (HOSPITAL_BASED_OUTPATIENT_CLINIC_OR_DEPARTMENT_OTHER)
Admission: EM | Admit: 2017-05-26 | Discharge: 2017-05-26 | Disposition: A | Discharge status: Home / Self Care | Payer: 59 | Attending: Emergency Medicine | Admitting: Emergency Medicine

## 2017-05-26 ENCOUNTER — Encounter (HOSPITAL_BASED_OUTPATIENT_CLINIC_OR_DEPARTMENT_OTHER): Payer: Self-pay | Admitting: Emergency Medicine

## 2017-05-26 ENCOUNTER — Other Ambulatory Visit: Payer: Self-pay

## 2017-05-26 DIAGNOSIS — Z91040 Latex allergy status: Secondary | ICD-10-CM | POA: Diagnosis not present

## 2017-05-26 DIAGNOSIS — R197 Diarrhea, unspecified: Secondary | ICD-10-CM | POA: Insufficient documentation

## 2017-05-26 DIAGNOSIS — N83201 Unspecified ovarian cyst, right side: Secondary | ICD-10-CM | POA: Diagnosis not present

## 2017-05-26 DIAGNOSIS — Z79899 Other long term (current) drug therapy: Secondary | ICD-10-CM | POA: Insufficient documentation

## 2017-05-26 DIAGNOSIS — R61 Generalized hyperhidrosis: Secondary | ICD-10-CM | POA: Diagnosis not present

## 2017-05-26 DIAGNOSIS — I10 Essential (primary) hypertension: Secondary | ICD-10-CM | POA: Insufficient documentation

## 2017-05-26 DIAGNOSIS — R638 Other symptoms and signs concerning food and fluid intake: Secondary | ICD-10-CM | POA: Diagnosis not present

## 2017-05-26 DIAGNOSIS — R1031 Right lower quadrant pain: Secondary | ICD-10-CM | POA: Insufficient documentation

## 2017-05-26 DIAGNOSIS — R1033 Periumbilical pain: Secondary | ICD-10-CM | POA: Insufficient documentation

## 2017-05-26 DIAGNOSIS — N83209 Unspecified ovarian cyst, unspecified side: Secondary | ICD-10-CM

## 2017-05-26 DIAGNOSIS — R110 Nausea: Secondary | ICD-10-CM | POA: Diagnosis not present

## 2017-05-26 LAB — COMPREHENSIVE METABOLIC PANEL WITH GFR
ALT: 47 U/L (ref 14–54)
AST: 27 U/L (ref 15–41)
Albumin: 4.4 g/dL (ref 3.5–5.0)
Alkaline Phosphatase: 83 U/L (ref 38–126)
Anion gap: 8 (ref 5–15)
BUN: 10 mg/dL (ref 6–20)
CO2: 26 mmol/L (ref 22–32)
Calcium: 9.4 mg/dL (ref 8.9–10.3)
Chloride: 106 mmol/L (ref 101–111)
Creatinine, Ser: 0.8 mg/dL (ref 0.44–1.00)
GFR calc Af Amer: >60 mL/min
GFR calc non Af Amer: >60 mL/min
Glucose, Bld: 77 mg/dL (ref 65–99)
Potassium: 3.8 mmol/L (ref 3.5–5.1)
Sodium: 140 mmol/L (ref 135–145)
Total Bilirubin: 0.6 mg/dL (ref 0.3–1.2)
Total Protein: 7.8 g/dL (ref 6.5–8.1)

## 2017-05-26 LAB — URINALYSIS, ROUTINE W REFLEX MICROSCOPIC
BILIRUBIN URINE: NEGATIVE
GLUCOSE, UA: NEGATIVE mg/dL
HGB URINE DIPSTICK: NEGATIVE
Ketones, ur: NEGATIVE mg/dL
Leukocytes, UA: NEGATIVE
Nitrite: NEGATIVE
PH: 6.5 (ref 5.0–8.0)
Protein, ur: NEGATIVE mg/dL
Specific Gravity, Urine: 1.015 (ref 1.005–1.030)

## 2017-05-26 LAB — CBC
HCT: 43.5 % (ref 36.0–46.0)
HEMOGLOBIN: 15 g/dL (ref 12.0–15.0)
MCH: 30.9 pg (ref 26.0–34.0)
MCHC: 34.5 g/dL (ref 30.0–36.0)
MCV: 89.5 fL (ref 78.0–100.0)
Platelets: 275 10*3/uL (ref 150–400)
RBC: 4.86 MIL/uL (ref 3.87–5.11)
RDW: 13.8 % (ref 11.5–15.5)
WBC: 11.2 10*3/uL — ABNORMAL HIGH (ref 4.0–10.5)

## 2017-05-26 LAB — LIPASE, BLOOD: Lipase: 46 U/L (ref 11–51)

## 2017-05-26 LAB — PREGNANCY, URINE: Preg Test, Ur: NEGATIVE

## 2017-05-26 MED ORDER — OXYCODONE-ACETAMINOPHEN 5-325 MG PO TABS
1.0000 | ORAL_TABLET | Freq: Once | ORAL | Status: AC
  Administered 2017-05-26: 1 via ORAL
  Filled 2017-05-26: qty 1

## 2017-05-26 MED ORDER — LOPERAMIDE HCL 2 MG PO CAPS: 2.0000 mg | ORAL_CAPSULE | Freq: Four times a day (QID) | ORAL | 0 refills | Status: DC | PRN

## 2017-05-26 MED ORDER — ACETAMINOPHEN 325 MG PO TABS
650.0000 mg | ORAL_TABLET | Freq: Once | ORAL | Status: AC
  Administered 2017-05-26: 650 mg via ORAL
  Filled 2017-05-26: qty 2

## 2017-05-26 MED ORDER — ACETAMINOPHEN 500 MG PO TABS: 500.0000 mg | ORAL_TABLET | Freq: Four times a day (QID) | ORAL | 0 refills | Status: DC | PRN

## 2017-05-26 MED ORDER — IBUPROFEN 400 MG PO TABS: 400.0000 mg | ORAL_TABLET | Freq: Four times a day (QID) | ORAL | 0 refills | Status: DC | PRN

## 2017-05-26 MED ORDER — IOPAMIDOL (ISOVUE-300) INJECTION 61%
100.0000 mL | Freq: Once | INTRAVENOUS | Status: AC | PRN
  Administered 2017-05-26: 100 mL via INTRAVENOUS

## 2017-05-26 MED ORDER — SODIUM CHLORIDE 0.9 % IV BOLUS
1000.0000 mL | Freq: Once | INTRAVENOUS | Status: AC
  Administered 2017-05-26: 1000 mL via INTRAVENOUS

## 2017-05-26 MED ORDER — SODIUM CHLORIDE 0.9 % IV SOLN
Freq: Once | INTRAVENOUS | Status: AC
  Administered 2017-05-26: 1000 mL via INTRAVENOUS

## 2017-05-26 MED ORDER — ONDANSETRON HCL 4 MG/2ML IJ SOLN
4.0000 mg | Freq: Once | INTRAMUSCULAR | Status: AC
  Administered 2017-05-26: 4 mg via INTRAVENOUS
  Filled 2017-05-26: qty 2

## 2017-05-26 MED ORDER — OXYCODONE-ACETAMINOPHEN 5-325 MG PO TABS: 1.0000 | ORAL_TABLET | Freq: Three times a day (TID) | ORAL | 0 refills | Status: DC | PRN

## 2017-05-26 MED FILL — ACETAMINOPHEN EXTRA STRENGT: 500 | 25 days supply | Qty: 100 | Fill #0

## 2017-05-26 MED FILL — OXYCODONE-ACETAMINOPHEN 5-3: 5-325 | 1 days supply | Qty: 5 | Fill #0

## 2017-05-26 MED FILL — IBUPROFEN 400 MG TABS: 400 | 7 days supply | Qty: 30 | Fill #0

## 2017-05-26 MED FILL — SM ANTI-DIARRHEAL 2 MG CAPL: 2 | 12 days supply | Qty: 48 | Fill #0

## 2017-05-26 NOTE — ED Provider Notes (Addendum)
Medical screening examination/treatment/procedure(s) were conducted as a shared visit with non-physician practitioner(s) and myself.  I personally evaluated the patient during the encounter.  None   Results for orders placed or performed during the hospital encounter of 05/26/17  Lipase, blood  Result Value Ref Range   Lipase 46 11 - 51 U/L  Comprehensive metabolic panel  Result Value Ref Range   Sodium 140 135 - 145 mmol/L   Potassium 3.8 3.5 - 5.1 mmol/L   Chloride 106 101 - 111 mmol/L   CO2 26 22 - 32 mmol/L   Glucose, Bld 77 65 - 99 mg/dL   BUN 10 6 - 20 mg/dL   Creatinine, Ser 1.61 0.44 - 1.00 mg/dL   Calcium 9.4 8.9 - 09.6 mg/dL   Total Protein 7.8 6.5 - 8.1 g/dL   Albumin 4.4 3.5 - 5.0 g/dL   AST 27 15 - 41 U/L   ALT 47 14 - 54 U/L   Alkaline Phosphatase 83 38 - 126 U/L   Total Bilirubin 0.6 0.3 - 1.2 mg/dL   GFR calc non Af Amer >60 >60 mL/min   GFR calc Af Amer >60 >60 mL/min   Anion gap 8 5 - 15  CBC  Result Value Ref Range   WBC 11.2 (H) 4.0 - 10.5 K/uL   RBC 4.86 3.87 - 5.11 MIL/uL   Hemoglobin 15.0 12.0 - 15.0 g/dL   HCT 04.5 40.9 - 81.1 %   MCV 89.5 78.0 - 100.0 fL   MCH 30.9 26.0 - 34.0 pg   MCHC 34.5 30.0 - 36.0 g/dL   RDW 91.4 78.2 - 95.6 %   Platelets 275 150 - 400 K/uL  Urinalysis, Routine w reflex microscopic  Result Value Ref Range   Color, Urine YELLOW YELLOW   APPearance CLEAR CLEAR   Specific Gravity, Urine 1.015 1.005 - 1.030   pH 6.5 5.0 - 8.0   Glucose, UA NEGATIVE NEGATIVE mg/dL   Hgb urine dipstick NEGATIVE NEGATIVE   Bilirubin Urine NEGATIVE NEGATIVE   Ketones, ur NEGATIVE NEGATIVE mg/dL   Protein, ur NEGATIVE NEGATIVE mg/dL   Nitrite NEGATIVE NEGATIVE   Leukocytes, UA NEGATIVE NEGATIVE  Pregnancy, urine  Result Value Ref Range   Preg Test, Ur NEGATIVE NEGATIVE   No results found.   Patient seen by me along with the physician assistant.  Patient with onset of diarrhea type illness on Monday evening.  It has continued still  having multiple diarrhea bowel movements has had 6 today.  No real vomiting there is nausea.  Was some vomiting early on but really none since.  Started on Tuesday, and was located mostly in the right lower quadrant.  Patient with persistent tenderness right lower quadrant although the persistent diarrhea make she think of more of a viral type illness since patient works at daycare but the point tenderness is concerning and does warrant CT scan to rule out an acute abdominal process.  CAT scan not functional here since Monday night patient will be transferred to [facility] emergency department to have the CT scan completed.  If the scan shows no acute process requiring admission patient can be discharged home and treated with antidiarrhea medicine.  Like Imodium.   Patient received 1 L of fluid here and will continue with IV hydration.  Labs are significant for a leukocytosis which also raises a little bit of the concern.  Patient is nontoxic no acute distress.  Discussed with Dr. Patria Mane who will accept the patient in transfer  [facility].   Vanetta MuldersZackowski, Rayquan Amrhein, MD 05/26/17 1444    Vanetta MuldersZackowski, Jeane Cashatt, MD 05/26/17 [phone]

## 2017-05-26 NOTE — ED Provider Notes (Addendum)
[facility] EMERGENCY DEPARTMENT Provider Note   CSN: [id number] Arrival date & time: 05/26/17  4098     History   Chief Complaint Chief Complaint  Patient presents with  . Abdominal Pain    HPI Barbara Summers is a 37 y.o. female.  HPI   Pt is a 37 y/o female who presents to the ED today c/o diarrhea that began about 3 days ago. States sxs have wrosened since onset. States diarrhea is watery, brownish/orange color. She has had about 15 episodes in the last 24 hours. Denies that she has noted any blood on TP or in toilet. Denies any recent antibiotic use. Has tried imodium with no relief. Has not eaten any spoiled or rotten foods recently.   Abd pain began yesterday. Initially pain was RLQ and now pain is periumbilical. Describes it at a sharp/stabbing pain. Rates pain 9.5/10. She reports sweats, reduced appetite, and nausea, but denies any documented fevers or episodes of vomiting. No urinary or vaginal sxs. LMP 3/4.   Has h/o cholecystectomy.  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   Colonoscopy & endoscopy from December 2018 was normal. No evidence of diverticulosis.   Past Medical History:  Diagnosis Date  . Heart murmur 08/2016  . History of bronchitis a yr ago  . History of migraine last one a month ago  . Hyperlipidemia   . Hypertension   . Kidney stone   . PONV (postoperative nausea and vomiting)     Patient Active Problem List   Diagnosis Date Noted  . SHOULDER PAIN, LEFT 12/05/2009  . ADHESIVE CAPSULITIS, LEFT 12/05/2009    Past Surgical History:  Procedure Laterality Date  . CESAREAN SECTION     x 4  . CHOLECYSTECTOMY    . ECTOPIC PREGNANCY SURGERY    . KNEE ARTHROSCOPY Left   . TONSILLECTOMY    . TUBAL LIGATION    . ULNAR COLLATERAL LIGAMENT REPAIR Left 11/15/2014   Procedure: LEFT THUMB ULNAR COLLATERAL LIGAMENT REPAIR AND OR RECONTSTRUCTION;  Surgeon: Bradly Bienenstock, MD;  Location: [facility] OR;  Service:  Orthopedics;  Laterality: Left;     OB History   None      Home Medications    Prior to Admission medications   Medication Sig Start Date End Date Taking? Authorizing Provider  Atorvastatin Calcium (LIPITOR PO) Take 80 mg daily by mouth.    Yes [provider]  buPROPion (WELLBUTRIN) 75 MG tablet Take 75 tablets by mouth 2 (two) times daily. 01/13/17  Yes [provider]  fenofibrate 54 MG tablet Take 54 mg by mouth daily. 01/14/17  Yes [provider]  acetaminophen (TYLENOL) 500 MG tablet Take 1 tablet (500 mg total) by mouth every 6 (six) hours as needed. 05/26/17   Abrar Koone S, PA-C  ibuprofen (ADVIL,MOTRIN) 400 MG tablet Take 1 tablet (400 mg total) by mouth every 6 (six) hours as needed. 05/26/17   Upton Russey S, PA-C  linaclotide (LINZESS) 145 MCG CAPS capsule Take 1 capsule (145 mcg total) by mouth daily before breakfast. 02/03/17   Danis, Andreas Blower, MD  loperamide (IMODIUM) 2 MG capsule Take 1 capsule (2 mg total) by mouth 4 (four) times daily as needed for diarrhea or loose stools. Do not take more than 16mg /day of this medication 05/26/17   Erryn Dickison S, PA-C  ondansetron (ZOFRAN) 4 MG tablet Take 1 tablet (4 mg total) by mouth every 8 (eight) hours as  needed for nausea or vomiting. 04/16/17   Tilden Fossa, MD  oxyCODONE-acetaminophen (PERCOCET/ROXICET) 5-325 MG tablet Take 1 tablet by mouth every 8 (eight) hours as needed for severe pain. 05/26/17   Ellenore Roscoe S, PA-C    Family History Family History  Problem Relation Age of Onset  . Heart disease Father   . Heart disease Maternal Grandmother   . Heart disease Paternal Grandfather   . Heart disease Maternal Aunt   . Colon polyps Maternal Aunt   . Colon cancer Neg Hx   . Esophageal cancer Neg Hx   . Pancreatic cancer Neg Hx   . Stomach cancer Neg Hx     Social History Social History   Tobacco Use  . Smoking status: Never Smoker  . Smokeless tobacco: Never Used    Substance Use Topics  . Alcohol use: Yes    Comment: occasionally  . Drug use: No     Allergies   Hydromorphone hcl; Latex; Vancomycin; Dilaudid [hydromorphone hcl]; Tramadol; Flagyl [metronidazole]; and Zithromax [azithromycin]   Review of Systems Review of Systems  Constitutional: Positive for appetite change and chills. Negative for fever.  HENT: Negative for sore throat.   Eyes: Negative for pain and visual disturbance.  Respiratory: Negative for cough and shortness of breath.   Cardiovascular: Negative for chest pain and palpitations.  Gastrointestinal: Positive for abdominal pain, diarrhea and nausea. Negative for blood in stool, constipation and vomiting.  Genitourinary: Negative for dysuria, flank pain, hematuria, urgency, vaginal bleeding and vaginal discharge.  Musculoskeletal: Negative for arthralgias and back pain.  Skin: Negative for color change and rash.  Neurological: Negative for light-headedness and headaches.  All other systems reviewed and are negative.    Physical Exam Updated Vital Signs BP 127/83 (BP Location: Right Arm)   Pulse 64   Temp 98.8 F (37.1 C) (Oral)   Resp 18   Ht 5\' 6"  (1.676 m)   Wt 81.6 kg (180 lb)   LMP 05/03/2017 (Approximate)   SpO2 99%   BMI 29.05 kg/m   Physical Exam  Constitutional: She appears well-developed and well-nourished. No distress.  Nontoxic, no acute distress.  HENT:  Head: Normocephalic and atraumatic.  Eyes: Pupils are equal, round, and reactive to light. Conjunctivae and EOM are normal.  Neck: Neck supple.  Cardiovascular: Normal rate, regular rhythm, normal heart sounds and intact distal pulses.  No murmur heard. Pulmonary/Chest: Effort normal and breath sounds normal. No respiratory distress. She has no wheezes.  Abdominal: Soft. Bowel sounds are normal. She exhibits no distension and no ascites. There is tenderness in the right lower quadrant. There is no rigidity, no rebound, no guarding and no CVA  tenderness.  Musculoskeletal: She exhibits no edema.  Neurological: She is alert.  Skin: Skin is warm and dry.  Psychiatric: She has a normal mood and affect.  Nursing note and vitals reviewed.    ED Treatments / Results  Labs (all labs ordered are listed, but only abnormal results are displayed) Labs Reviewed  CBC - Abnormal; Notable for the following components:      Result Value   WBC 11.2 (*)    All other components within normal limits  LIPASE, BLOOD  COMPREHENSIVE METABOLIC PANEL  URINALYSIS, ROUTINE W REFLEX MICROSCOPIC  PREGNANCY, URINE    EKG None  Radiology Ct Abdomen Pelvis W Contrast  Result Date: 05/26/2017 CLINICAL DATA:  Abdominal pain EXAM: CT ABDOMEN AND PELVIS WITH CONTRAST TECHNIQUE: Multidetector CT imaging of the abdomen and pelvis was performed using  the standard protocol following bolus administration of intravenous contrast. CONTRAST:  100mL ISOVUE-300 IOPAMIDOL (ISOVUE-300) INJECTION 61% COMPARISON:  11/16/2016 FINDINGS: Lower chest: No acute abnormality. Hepatobiliary: Stable hypodensity is noted in the anterior aspect of the right lobe of the liver most consistent with cyst. A few scattered smaller areas of decreased attenuation are noted inferiorly consistent with cysts. The gallbladder has been surgically removed. Pancreas: Unremarkable. No pancreatic ductal dilatation or surrounding inflammatory changes. Spleen: Normal in size without focal abnormality. Adrenals/Urinary Tract: Adrenal glands are within normal limits. Kidneys are well visualized bilaterally. Prominent extrarenal pelvis is again noted on the right. No obstructive changes or renal calculi are seen. The bladder is well distended. Stomach/Bowel: The appendix is well visualized and within normal limits. No obstructive or inflammatory changes are identified. Vascular/Lymphatic: No significant vascular findings are present. No enlarged abdominal or pelvic lymph nodes. Circumaortic left renal vein is  noted. Reproductive: Fluid is noted in the uterine cavity consistent with the current menstrual status. Previously seen right ovarian cyst is not as well appreciated on the current exam. Some associated free fluid is noted and the possibility of ruptured ovarian cyst deserves consideration given the patient's clinical history. No other focal abnormality is noted. Changes of prior tubal ligation are noted on the right. Other: Free fluid as described above which may be related to recent ovarian cyst rupture. Musculoskeletal: No acute bony abnormality noted. IMPRESSION: Findings suggestive of right ovarian cyst rupture. Normal-appearing appendix. Stable chronic changes as described above. Electronically Signed   By: Alcide CleverMark  Lukens M.D.   On: 05/26/2017 15:21    Procedures Procedures (including critical care time)  Medications Ordered in ED Medications  ondansetron (ZOFRAN) injection 4 mg (4 mg Intravenous Given 05/26/17 1300)  sodium chloride 0.9 % bolus 1,000 mL (0 mLs Intravenous Stopped 05/26/17 1400)  acetaminophen (TYLENOL) tablet 650 mg (650 mg Oral Given 05/26/17 1300)  0.9 %  sodium chloride infusion ( Intravenous Stopped 05/26/17 1555)  iopamidol (ISOVUE-300) 61 % injection 100 mL (100 mLs Intravenous Contrast Given 05/26/17 1507)  oxyCODONE-acetaminophen (PERCOCET/ROXICET) 5-325 MG per tablet 1 tablet (1 tablet Oral Given 05/26/17 1558)     Initial Impression / Assessment and Plan / ED Course  I have reviewed the triage vital signs and the nursing notes.  Pertinent labs & imaging results that were available during my care of the patient were reviewed by me and considered in my medical decision making (see chart for details).     2:18 PM Spoke with [facility] and he will accept the patient for transfer with plan for ct abd/pelvis with IV contrast to r/o appendicitis. If CT negative pt can likely be treated with imodium for sxs of diarrhea.   CT scan repaired prior  to transfer. Pt will have ct scan completed here at [facility].   Discussed pt presentation and exam findings with Dr. Deretha EmoryZackowski, who personally evaluated pt and agrees with the plan to discharge pt with symptomatic tx and outpt f/u.   Final Clinical Impressions(s) / ED Diagnoses   Final diagnoses:  Right lower quadrant abdominal pain  Diarrhea, unspecified type  Ovarian cyst rupture   37 year old female with right lower quadrant abdominal pain, nausea and diarrhea. Vital signs stable.  Afebrile. Nontoxic and nonseptic appearing.   Fluid bolus and pain medications given in the ED.  Patient reports improvement of pain following this.  Has had no episodes of vomiting in the ED.      Lab  work significant for mild leukocytosis, but otherwise reassuring.  UA negative for UTI.  Depression negative. No recent antibiotic use, do not suspect C. difficile.  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  Suspect diarrhea due to viral gastroenteritis and will treat symptomatically with Imodium and fluid hydration.  Given right lower quadrant tenderness and leukocytosis, CT abdomen pelvis with contrast ordered to rule out appendicitis.  CT negative for appendicitis, however did show ruptured ovarian cyst with small amount of free fluid noted.  Doubt hemorrhagic rupture.  Patient safe to be treated with Tylenol, ibuprofen, Percocet.  Advised to follow-up with her doctor within 1 week for reevaluation.  Advised to return to the emergency department for any new or worsening symptoms.  Patient and her husband understand the plan and reasons return to the ED.  All questions were answered.  ED Discharge Orders        Ordered    loperamide (IMODIUM) 2 MG capsule  4 times daily PRN     05/26/17 1547    oxyCODONE-acetaminophen (PERCOCET/ROXICET) 5-325 MG tablet  Every 8 hours PRN     05/26/17 1547    acetaminophen (TYLENOL) 500 MG tablet  Every 6 hours PRN     05/26/17 1547     ibuprofen (ADVIL,MOTRIN) 400 MG tablet  Every 6 hours PRN     05/26/17 1547       Indica Marcott S, PA-C 05/26/17 2129    Karrie Meres, PA-C 05/26/17 2130    Vanetta Mulders, MD 05/27/17 [phone]

## 2017-05-26 NOTE — Discharge Instructions (Addendum)
For your abdominal pain you may alternate taking 500mg  Tylenol every 6 hours for pain. You may also take 400mg  Ibuprofen every 6 hours. If you have breakthrough pain you may also take one Percocet every 8 hours for pain. Do not take more than 4000mg  of Tylenol per day. Do not drive, work, or operate machinery while taking percocet as it may make you drowsy.   For your diarrhea you may take 2mg  imodium every 4 hours as needed for diarrhea. Do not take more than 16mg  of this medication per day.   Please follow up with your primary doctor within the next 5-7 days.  Please also follow up with your ob-gyn doctor within the next 7-10 days for re-evaluation of your symptoms. Please return to the ER sooner if you have any new or worsening symptoms, or if you have any of the following symptoms:  Abdominal pain that does not go away.  You have a fever.  You keep throwing up (vomiting).  The pain is felt only in portions of the abdomen. Pain in the right side could possibly be appendicitis. In an adult, pain in the left lower portion of the abdomen could be colitis or diverticulitis.  You pass bloody or black tarry stools.  There is bright red blood in the stool.  The constipation stays for more than 4 days.  There is belly (abdominal) or rectal pain.  You do not seem to be getting better.  You have any questions or concerns.

## 2017-05-26 NOTE — ED Notes (Signed)
Report given to Scientist, physiologicalatty RN, charge nurse

## 2017-05-26 NOTE — ED Triage Notes (Signed)
Patient reports right sided abdominal pain x 2 days.  C/o 16-17 episodes of diarrhea over the past 2 days.  Endorses nausea and vomiting as well.  States unsure if blood in stool.

## 2017-07-26 ENCOUNTER — Emergency Department (HOSPITAL_BASED_OUTPATIENT_CLINIC_OR_DEPARTMENT_OTHER): Payer: 59

## 2017-07-26 ENCOUNTER — Emergency Department (HOSPITAL_BASED_OUTPATIENT_CLINIC_OR_DEPARTMENT_OTHER)
Admission: EM | Admit: 2017-07-26 | Discharge: 2017-07-26 | Disposition: A | Discharge status: Home / Self Care | Payer: 59 | Attending: Emergency Medicine | Admitting: Emergency Medicine

## 2017-07-26 ENCOUNTER — Other Ambulatory Visit: Payer: Self-pay

## 2017-07-26 ENCOUNTER — Encounter (HOSPITAL_BASED_OUTPATIENT_CLINIC_OR_DEPARTMENT_OTHER): Payer: Self-pay

## 2017-07-26 DIAGNOSIS — I10 Essential (primary) hypertension: Secondary | ICD-10-CM | POA: Insufficient documentation

## 2017-07-26 DIAGNOSIS — W1849XA Other slipping, tripping and stumbling without falling, initial encounter: Secondary | ICD-10-CM | POA: Insufficient documentation

## 2017-07-26 DIAGNOSIS — Z79899 Other long term (current) drug therapy: Secondary | ICD-10-CM | POA: Diagnosis not present

## 2017-07-26 DIAGNOSIS — Z91040 Latex allergy status: Secondary | ICD-10-CM | POA: Diagnosis not present

## 2017-07-26 DIAGNOSIS — S99911A Unspecified injury of right ankle, initial encounter: Secondary | ICD-10-CM | POA: Diagnosis present

## 2017-07-26 DIAGNOSIS — Y9301 Activity, walking, marching and hiking: Secondary | ICD-10-CM | POA: Diagnosis not present

## 2017-07-26 DIAGNOSIS — S93401A Sprain of unspecified ligament of right ankle, initial encounter: Secondary | ICD-10-CM | POA: Diagnosis not present

## 2017-07-26 DIAGNOSIS — Y999 Unspecified external cause status: Secondary | ICD-10-CM | POA: Insufficient documentation

## 2017-07-26 DIAGNOSIS — Y929 Unspecified place or not applicable: Secondary | ICD-10-CM | POA: Insufficient documentation

## 2017-07-26 MED ORDER — HYDROCODONE-ACETAMINOPHEN 5-325 MG PO TABS
2.0000 | ORAL_TABLET | Freq: Once | ORAL | Status: AC
  Administered 2017-07-26: 2 via ORAL
  Filled 2017-07-26: qty 2

## 2017-07-26 NOTE — ED Notes (Signed)
Pt asks to speak with this rn during checkout. Pt states "I don't appreciate the way I was treated here today. I was here first and a lot of other people got service before me. I asked the doctor nicely for pain medicine to take home, and because I have tattoos she fucking judged me and wouldn't give me anything for this pain!" explained to pt that we gave her 2 vicodin for her pain, pt states "then what the fuck am I going to do when I get home, just suffer?" explained to pt that dr. Fredderick Phenix recommends tylenol es for her ongoing pain. Pt states "I need a fucking work note too, I'm on my damn feet for 12 hours!" explained that I would ask dr. Fredderick Phenix to come talk with her about the pain medication and work note if she would wait for me to go get her, pt states "fuck that, I don't have all day to stand around here waiting for her! I'm going to call ya'll's boss and tell him how you treated me like a piece of shit because I have tattoos!" pt leaves ed amb with steady gait using crutches with family.

## 2017-07-26 NOTE — ED Notes (Signed)
Pt requests to speak with md prior to leaving, states "she promised me she would show me my xray." dr. Fredderick Phenix informed.

## 2017-07-26 NOTE — ED Triage Notes (Signed)
Pt states she fell/twisted right ankle approx 20 min PTA-presents to triage in w/c

## 2017-07-26 NOTE — ED Notes (Signed)
This rn speaks with dr. Fredderick Phenix between patients to ensure 2 days off work with no restrictions to return. Pt given work note, states "thanks a lot for nothing bitch!" leaves ed amb using crutches with husband and daughter.

## 2017-07-26 NOTE — ED Provider Notes (Signed)
[facility] EMERGENCY DEPARTMENT Provider Note   CSN: [id number] Arrival date & time: 07/26/17  1146     History   Chief Complaint Chief Complaint  Patient presents with  . Ankle Injury    HPI Barbara Summers is a 37 y.o. female.  Patient is a 37 year old female who presents with right ankle pain.  She states about 20 minutes prior to arrival she twisted her right ankle while she was walking.  She denies any other injuries.  She has constant throbbing pain and swelling to her right ankle.  She has not taken any medications prior to arrival.     Past Medical History:  Diagnosis Date  . Heart murmur 08/2016  . History of bronchitis a yr ago  . History of migraine last one a month ago  . Hyperlipidemia   . Hypertension   . Kidney stone   . PONV (postoperative nausea and vomiting)     Patient Active Problem List   Diagnosis Date Noted  . SHOULDER PAIN, LEFT 12/05/2009  . ADHESIVE CAPSULITIS, LEFT 12/05/2009    Past Surgical History:  Procedure Laterality Date  . CESAREAN SECTION     x 4  . CHOLECYSTECTOMY    . ECTOPIC PREGNANCY SURGERY    . KNEE ARTHROSCOPY Left   . TONSILLECTOMY    . TUBAL LIGATION    . ULNAR COLLATERAL LIGAMENT REPAIR Left 11/15/2014   Procedure: LEFT THUMB ULNAR COLLATERAL LIGAMENT REPAIR AND OR RECONTSTRUCTION;  Surgeon: Bradly Bienenstock, MD;  Location: [facility] OR;  Service: Orthopedics;  Laterality: Left;     OB History   None      Home Medications    Prior to Admission medications   Medication Sig Start Date End Date Taking? Authorizing Provider  acetaminophen (TYLENOL) 500 MG tablet Take 1 tablet (500 mg total) by mouth every 6 (six) hours as needed. 05/26/17   Couture, Cortni S, PA-C  Atorvastatin Calcium (LIPITOR PO) Take 80 mg daily by mouth.     [provider]  buPROPion (WELLBUTRIN) 75 MG tablet Take 75 tablets by mouth 2 (two) times daily. 01/13/17   [provider]  fenofibrate 54 MG tablet Take 54 mg by  mouth daily. 01/14/17   [provider]  ibuprofen (ADVIL,MOTRIN) 400 MG tablet Take 1 tablet (400 mg total) by mouth every 6 (six) hours as needed. 05/26/17   Couture, Cortni S, PA-C  linaclotide (LINZESS) 145 MCG CAPS capsule Take 1 capsule (145 mcg total) by mouth daily before breakfast. 02/03/17   Danis, Andreas Blower, MD  loperamide (IMODIUM) 2 MG capsule Take 1 capsule (2 mg total) by mouth 4 (four) times daily as needed for diarrhea or loose stools. Do not take more than /day of this medication 05/26/17   Couture, Cortni S, PA-C  ondansetron (ZOFRAN) 4 MG tablet Take 1 tablet (4 mg total) by mouth every 8 (eight) hours as needed for nausea or vomiting. 04/16/17   Tilden Fossa, MD  oxyCODONE-acetaminophen (PERCOCET/ROXICET) 5-325 MG tablet Take 1 tablet by mouth every 8 (eight) hours as needed for severe pain. 05/26/17   Couture, Cortni S, PA-C    Family History Family History  Problem Relation Age of Onset  . Heart disease Father   . Heart disease Maternal Grandmother   . Heart disease Paternal Grandfather   . Heart disease Maternal Aunt   . Colon polyps Maternal Aunt   . Colon cancer Neg Hx   . Esophageal cancer Neg Hx   .  Pancreatic cancer Neg Hx   . Stomach cancer Neg Hx     Social History Social History   Tobacco Use  . Smoking status: Never Smoker  . Smokeless tobacco: Never Used  Substance Use Topics  . Alcohol use: Yes    Comment: occasionally  . Drug use: No     Allergies   Hydromorphone hcl; Latex; Vancomycin; Dilaudid [hydromorphone hcl]; Tramadol; Flagyl [metronidazole]; and Zithromax [azithromycin]   Review of Systems Review of Systems  Constitutional: Negative for fever.  Gastrointestinal: Negative for nausea and vomiting.  Musculoskeletal: Positive for arthralgias and joint swelling. Negative for back pain and neck pain.  Skin: Negative for wound.  Neurological: Negative for weakness, numbness and headaches.     Physical Exam Updated  Vital Signs BP 134/85 (BP Location: Right Arm)   Pulse 76   Temp 98.3 F (36.8 C) (Oral)   Resp 16   Ht  (1.676 m)   Wt 81.6 kg (180 lb)   LMP 06/27/2017   SpO2 97%   BMI 29.05 kg/m   Physical Exam  Constitutional: She is oriented to person, place, and time. She appears well-developed and well-nourished.  HENT:  Head: Normocephalic and atraumatic.  Neck: Normal range of motion. Neck supple.  Cardiovascular: Normal rate.  Pulmonary/Chest: Effort normal.  Musculoskeletal: She exhibits edema and tenderness.  Patient has mild swelling over the lateral malleolus of the right ankle.  There is tenderness over this area.  There is no other swelling noted to the foot or ankle.  No pain in the proximal fibula.  No pain to the foot.  Pedal pulses are intact.  She has normal sensation and motor function the foot.  No wounds.  Neurological: She is alert and oriented to person, place, and time.  Skin: Skin is warm and dry.  Psychiatric: She has a normal mood and affect.     ED Treatments / Results  Labs (all labs ordered are listed, but only abnormal results are displayed) Labs Reviewed - No data to display  EKG None  Radiology Dg Ankle Complete Right  Result Date: 07/26/2017 CLINICAL DATA:  Pain following fall EXAM: RIGHT ANKLE - COMPLETE 3+ VIEW COMPARISON:  December 25, 2004 FINDINGS: Frontal, oblique, and lateral views were obtained. No evident fracture or joint effusion. Joint spaces appear normal. No erosive change. The ankle mortise appears intact. IMPRESSION: No evident fracture or arthropathic change. Ankle mortise appears intact. Electronically Signed   By: Bretta Bang III M.D.   On: 07/26/2017 12:10    Procedures Procedures (including critical care time)  Medications Ordered in ED Medications  HYDROcodone-acetaminophen (NORCO/VICODIN) 5-325 MG per tablet 2 tablet (has no administration in time range)     Initial Impression / Assessment and Plan / ED Course    I have reviewed the triage vital signs and the nursing notes.  Pertinent labs & imaging results that were available during my care of the patient were reviewed by me and considered in my medical decision making (see chart for details).     Patient has no evidence of fracture.  She was placed in a Velcro ankle splint.  She was offered crutches but states she has some at home.  She was advised in ice and elevation.  She was advised to use ibuprofen for symptomatically.  She was given 1 dose of Vicodin in the ED.  She has previously seen [facility] orthopedics and will follow up with them if her symptoms are not improving.  Final Clinical  Impressions(s) / ED Diagnoses   Final diagnoses:  Sprain of right ankle, unspecified ligament, initial encounter    ED Discharge Orders    None       Rolan Bucco, MD 07/26/17 1349

## 2017-07-26 NOTE — ED Notes (Signed)
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 States "I'm sorry for how I acted a few minutes ago, I'd really like a work note." pt informed that I will write her a work note for today and tomorrow.

## 2017-08-03 ENCOUNTER — Other Ambulatory Visit: Payer: Self-pay | Admitting: Orthopedic Surgery

## 2017-08-03 DIAGNOSIS — M25471 Effusion, right ankle: Secondary | ICD-10-CM

## 2017-08-04 ENCOUNTER — Ambulatory Visit
Admission: RE | Admit: 2017-08-04 | Discharge: 2017-08-04 | Disposition: A | Discharge status: Home / Self Care | Payer: 59 | Source: Ambulatory Visit | Attending: Orthopedic Surgery | Admitting: Orthopedic Surgery

## 2017-08-04 DIAGNOSIS — M25471 Effusion, right ankle: Secondary | ICD-10-CM

## 2017-10-21 NOTE — Pre-Procedure Instructions (Signed)
Barbara Summers  10/21/2017      [facility] [place], [place] - [address] [place] [address] Phone: [phone] Fax: [phone]    Your procedure is scheduled on October 28, 2017.  [facility] Admitting at 1045 AM.  Call this number if you have problems the morning of surgery:  [phone]   Remember:  Do not eat or drink after midnight.    Take these medicines the morning of surgery with A SIP OF WATER (none)  7 days prior to surgery STOP taking any Aspirin (unless otherwise instructed by your surgeon), Aleve, Naproxen, Ibuprofen, Motrin, Advil, Goody's, BC's, all herbal medications, fish oil, and all vitamins   Do not wear jewelry, make-up or nail polish.  Do not wear lotions, powders, or perfumes, or deodorant.  Do not shave 48 hours prior to surgery.    Do not bring valuables to the hospital.  [facility] is not responsible for any belongings or valuables.  Contacts, dentures or bridgework may not be worn into surgery.  Leave your suitcase in the car.  After surgery it may be brought to your room.  For patients admitted to the hospital, discharge time will be determined by your treatment team.  Patients discharged the day of surgery will not be allowed to drive home.    [facility]- Preparing For Surgery  Before surgery, you can play an important role. Because skin is not sterile, your skin needs to be as free of germs as possible. You can reduce the number of germs on your skin by washing with CHG (chlorahexidine gluconate) Soap before surgery.  CHG is an antiseptic cleaner which kills germs and bonds with the skin to continue killing germs even after washing.    Oral Hygiene is also important to reduce your risk of infection.  Remember - BRUSH YOUR TEETH THE MORNING OF SURGERY WITH YOUR REGULAR TOOTHPASTE  Please do not use if you have an allergy to CHG or antibacterial  soaps. If your skin becomes reddened/irritated stop using the CHG.  Do not shave (including legs and underarms) for at least 48 hours prior to first CHG shower. It is OK to shave your face.  Please follow these instructions carefully.   1. Shower the NIGHT BEFORE SURGERY and the MORNING OF SURGERY with CHG.   2. If you chose to wash your hair, wash your hair first as usual with your normal shampoo.  3. After you shampoo, rinse your hair and body thoroughly to remove the shampoo.  4. Use CHG as you would any other liquid soap. You can apply CHG directly to the skin and wash gently with a scrungie or a clean washcloth.   5. Apply the CHG Soap to your body ONLY FROM THE NECK DOWN.  Do not use on open wounds or open sores. Avoid contact with your eyes, ears, mouth and genitals (private parts). Wash Face and genitals (private parts)  with your normal soap.  6. Wash thoroughly, paying special attention to the area where your surgery will be performed.  7. Thoroughly rinse your body with warm water from the neck down.  8. DO NOT shower/wash with your normal soap after using and rinsing off the CHG Soap.  9. Pat yourself dry with a CLEAN TOWEL.  10. Wear CLEAN PAJAMAS to bed the night before surgery, wear comfortable clothes the morning of surgery  11. Place CLEAN SHEETS on your bed the night of your first shower and DO NOT SLEEP WITH PETS.  Day of Surgery:  Do not apply any deodorants/lotions.  Please wear clean clothes to the hospital/surgery center.   Remember to brush your teeth WITH YOUR REGULAR TOOTHPASTE.  Please read over the following fact sheets that you were given. Pain Booklet, Coughing and Deep Breathing, MRSA Information and Surgical Site Infection Prevention

## 2017-10-22 ENCOUNTER — Other Ambulatory Visit: Payer: Self-pay

## 2017-10-22 ENCOUNTER — Encounter (HOSPITAL_COMMUNITY): Payer: Self-pay

## 2017-10-22 ENCOUNTER — Encounter (HOSPITAL_COMMUNITY)
Admission: RE | Admit: 2017-10-22 | Discharge: 2017-10-22 | Disposition: A | Discharge status: Home / Self Care | Payer: 59 | Source: Ambulatory Visit | Attending: Orthopedic Surgery | Admitting: Orthopedic Surgery

## 2017-10-22 DIAGNOSIS — Z9049 Acquired absence of other specified parts of digestive tract: Secondary | ICD-10-CM | POA: Insufficient documentation

## 2017-10-22 DIAGNOSIS — Z87442 Personal history of urinary calculi: Secondary | ICD-10-CM | POA: Diagnosis not present

## 2017-10-22 DIAGNOSIS — Z01818 Encounter for other preprocedural examination: Secondary | ICD-10-CM | POA: Diagnosis present

## 2017-10-22 DIAGNOSIS — Z01812 Encounter for preprocedural laboratory examination: Secondary | ICD-10-CM | POA: Diagnosis not present

## 2017-10-22 DIAGNOSIS — I10 Essential (primary) hypertension: Secondary | ICD-10-CM | POA: Insufficient documentation

## 2017-10-22 DIAGNOSIS — Z0183 Encounter for blood typing: Secondary | ICD-10-CM | POA: Diagnosis not present

## 2017-10-22 DIAGNOSIS — M5116 Intervertebral disc disorders with radiculopathy, lumbar region: Secondary | ICD-10-CM | POA: Diagnosis not present

## 2017-10-22 DIAGNOSIS — Z01810 Encounter for preprocedural cardiovascular examination: Secondary | ICD-10-CM | POA: Insufficient documentation

## 2017-10-22 DIAGNOSIS — Z79899 Other long term (current) drug therapy: Secondary | ICD-10-CM | POA: Diagnosis not present

## 2017-10-22 DIAGNOSIS — E785 Hyperlipidemia, unspecified: Secondary | ICD-10-CM | POA: Diagnosis not present

## 2017-10-22 DIAGNOSIS — Z8249 Family history of ischemic heart disease and other diseases of the circulatory system: Secondary | ICD-10-CM | POA: Insufficient documentation

## 2017-10-22 HISTORY — DX: Personal history of urinary calculi: Z87.442

## 2017-10-22 LAB — CBC
HEMATOCRIT: 44.7 % (ref 36.0–46.0)
HEMOGLOBIN: 14.8 g/dL (ref 12.0–15.0)
MCH: 30.8 pg (ref 26.0–34.0)
MCHC: 33.1 g/dL (ref 30.0–36.0)
MCV: 92.9 fL (ref 78.0–100.0)
Platelets: 270 10*3/uL (ref 150–400)
RBC: 4.81 MIL/uL (ref 3.87–5.11)
RDW: 12.8 % (ref 11.5–15.5)
WBC: 9.3 10*3/uL (ref 4.0–10.5)

## 2017-10-22 LAB — BASIC METABOLIC PANEL
Anion gap: 11 (ref 5–15)
BUN: 10 mg/dL (ref 6–20)
CHLORIDE: 105 mmol/L (ref 98–111)
CO2: 22 mmol/L (ref 22–32)
Calcium: 9.5 mg/dL (ref 8.9–10.3)
Creatinine, Ser: 0.65 mg/dL (ref 0.44–1.00)
GFR calc Af Amer: >60 mL/min (ref >60)
GFR calc non Af Amer: >60 mL/min (ref >60)
GLUCOSE: 80 mg/dL (ref 70–99)
POTASSIUM: 4 mmol/L (ref 3.5–5.1)
Sodium: 138 mmol/L (ref 135–145)

## 2017-10-22 LAB — ABO/RH: ABO/RH(D): B POS

## 2017-10-22 LAB — TYPE AND SCREEN
ABO/RH(D): B POS
Antibody Screen: NEGATIVE

## 2017-10-22 LAB — SURGICAL PCR SCREEN
MRSA, PCR: NEGATIVE
Staphylococcus aureus: NEGATIVE

## 2017-10-22 NOTE — H&P (Addendum)
Patient ID: Barbara JanskyKimbely A Summers MRN: [id number] DOB/AGE: Dec 04, 1980 37 y.o.  Admit date: (Not on file)  Admission Diagnoses:  Degenerative Disc Disease  HPI: The patient is here today for a pre-operative History and Physical. They are scheduled for TLIF L4-5   on 10-28-17 with Dr[facility].  Pt reports a hx of good health.  Past Medical History: Past Medical History:  Diagnosis Date  . Heart murmur 08/2016  . History of bronchitis a yr ago  . History of migraine last one a month ago  . Hyperlipidemia   . Hypertension   . Kidney stone   . PONV (postoperative nausea and vomiting)     Surgical History: Past Surgical History:  Procedure Laterality Date  . CESAREAN SECTION     x 4  . CHOLECYSTECTOMY    . ECTOPIC PREGNANCY SURGERY    . KNEE ARTHROSCOPY Left   . TONSILLECTOMY    . TUBAL LIGATION    . ULNAR COLLATERAL LIGAMENT REPAIR Left 11/15/2014   Procedure: LEFT THUMB ULNAR COLLATERAL LIGAMENT REPAIR AND OR RECONTSTRUCTION;  Surgeon: Bradly BienenstockFred Ortmann, MD;  Location: [facility] OR;  Service: Orthopedics;  Laterality: Left;    Family History: Family History  Problem Relation Age of Onset  . Heart disease Father   . Heart disease Maternal Grandmother   . Heart disease Paternal Grandfather   . Heart disease Maternal Aunt   . Colon polyps Maternal Aunt   . Colon cancer Neg Hx   . Esophageal cancer Neg Hx   . Pancreatic cancer Neg Hx   . Stomach cancer Neg Hx     Social History: Social History   Socioeconomic History  . Marital status: Married    Spouse name: Not on file  . Number of children: Not on file  . Years of education: Not on file  . Highest education level: Not on file  Occupational History  . Not on file  Social Needs  . Financial resource strain: Not on file  . Food insecurity:    Worry: Not on file    Inability: Not on file  . Transportation needs:    Medical: Not on file    Non-medical: Not on file  Tobacco Use  . Smoking status:  Never Smoker  . Smokeless tobacco: Never Used  Substance and Sexual Activity  . Alcohol use: Yes    Comment: occasionally  . Drug use: No  . Sexual activity: Yes    Birth control/protection: Surgical  Lifestyle  . Physical activity:    Days per week: Not on file    Minutes per session: Not on file  . Stress: Not on file  Relationships  . Social connections:    Talks on phone: Not on file    Gets together: Not on file    Attends religious service: Not on file    Active member of club or organization: Not on file    Attends meetings of clubs or organizations: Not on file    Relationship status: Not on file  . Intimate partner violence:    Fear of current or ex partner: Not on file    Emotionally abused: Not on file    Physically abused: Not on file    Forced sexual activity: Not on file  Other Topics Concern  . Not on file  Social History Narrative  . Not on file    Allergies: Hydromorphone hcl; Latex; Vancomycin; Tramadol; Flagyl [metronidazole]; and Zithromax [azithromycin]  Medications:  I have reviewed the patient's current medications.  Vital Signs: No data found.  Radiology: No results found.  Labs: No results for input(s): WBC, RBC, HCT, PLT in the last 72 hours. No results for input(s): NA, K, CL, CO2, BUN, CREATININE, GLUCOSE, CALCIUM in the last 72 hours. No results for input(s): LABPT, INR in the last 72 hours.  Review of Systems: ROS  Physical Exam: There is no height or weight on file to calculate BMI.  Physical Exam  Constitutional: She is oriented to person, place, and time. She appears well-developed and well-nourished.  HENT:  Head: Normocephalic.  Cardiovascular: Normal rate and regular rhythm.  Respiratory: Effort normal and breath sounds normal.  GI: Soft. Bowel sounds are normal.  Neurological: She is alert and oriented to person, place, and time.  Skin: Skin is warm and dry.  Psychiatric: She has a normal mood and affect. Her behavior  is normal. Judgment and thought content normal.  Gait pattern: Patient has a slight limp due to right leg radicular pain.  No gross instability in her balance  Assistive devices: No assistive devices for ambulation  Neuro: Positive right foot drop with 4 out of 5 EHL/tibialis anterior weakness.  Positive right straight leg raise test seated and supine.  Decreased sensation to light touch in the right L4 and L5 dermatome.  Symmetrical 1+ deep tendon reflexes at the knee and the Achilles.  Negative Babinski test, no clonus 5 out of 5 strength in the remainder of the extremity.  No focal motor deficits in the left lower extremity.  Musculoskeletal: Significant back pain radiating into the right lower extremity.  No significant hip, knee, ankle pain with isolated joint range of motion.  Positive right SI joint pain with direct palpation but negative Faber test.  X-rays of the lumbar spine demonstrate collapse of the L4-5 disc space but no scoliosis.  Moderate degenerative changes at the L5-S1 level.  No spondylolisthesis.    MRI of the lumbar spine dated 09/12/17 demonstrates chronic discogenic fatty changes at the endplates of L4-5 and L5-S1 she has a large posterior lateral and central disc herniation extending into the right neural foramen.  This causes compression of the traversing L 5 and exiting L4 nerve root.   Assessment and Plan: Risks and benefits of surgery were discussed with the patient. These include: Infection, bleeding, death, stroke, paralysis, ongoing or worse pain, need for additional surgery, nonunion, leak of spinal fluid, adjacent segment degeneration requiring additional fusion surgery, Injury to abdominal vessels that can require anterior surgery to stop bleeding. Malposition of the cage and/or pedicle screws that could require additional surgery. Loss of bowel and bladder control. Postoperative hematoma causing neurologic compression that could require urgent or emergent  re-operation.  Goals of surgery: Reduced (not eliminated) pain, and improved quality of life.  [name], PAC for [name], MD [facility] [phone]  Patient presents today for definitive surgical management of her degenerative disc disease and neuropathic right leg pain.  She continues to have severe debilitating back buttock and radicular right leg pain.  At this point fortunately her insurance company did finally approved the fusion and so we are going to move forward with the transforaminal lumbar interbody fusion.  All appropriate risks benefits and alternatives were discussed with the patient and her family and consent was obtained.  All of their questions were encouraged and addressed.  There was no change in her clinical exam from her last office visit from 10/22/2017.

## 2017-10-22 NOTE — Progress Notes (Signed)
PCP -  Barbara Summers Cardiologist - Dr Dot Beenohrbeck  EKG - 10/22/2017  Stress Test - requested ECHO - requested Cardiac Cath - 09/27/16  Anesthesia review: cardiac records, Dr. Shon BatonBrooks pt  Patient denies shortness of breath, fever, cough and chest pain at PAT appointment   Patient verbalized understanding of instructions that were given to them at the PAT appointment. Patient was also instructed that they will need to review over the PAT instructions again at home before surgery.

## 2017-10-25 NOTE — Progress Notes (Addendum)
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 History includes never smoker, post-operative N/V, HLD, murmur ("2/6 Holosystolic murmur" 9/56/217/18/18, Dr. Dot Beenohrbeck). She had cardiology evaluation last year due to chest pain symptoms and family history of premature heart disease (cousin, CABG age 37). She had a cardiac cath 08/2016 that showed normal coronaries and EF. By notes, 09/08/16 stress echo was normal. Family history of brain aneurysms (negative MRA head for aneurysm or stenosis 09/17/16, [facility]).   Based on available information, I anticipate that she can proceed as planned if no acute changes. She will need a urine pregnancy test on the day of surgery.    VS: BP (!) 146/82   Pulse 64   Temp 37.1 C   Resp 20   Ht 5\' 7"  (1.702 m)   Wt 80.3 kg   LMP 10/02/2017   SpO2 100%   BMI 27.72 kg/m   PROVIDERS: Dorthula MatasJudd, Donna J, PA-C is PCP [facility] Care Everywhere) Cathlean Cowerohrbeck, Steven, MD is cardiologist (saw in 2018 for chest pain, had normal LHC). See [facility] Care Everywhere. There is a signed cardiology clearance letter by Laqueta JeanBrandy Davis, ANP.   LABS: Labs reviewed: Acceptable for surgery. (all labs ordered are listed, but only abnormal results are displayed)  Labs Reviewed  SURGICAL PCR SCREEN  CBC  BASIC METABOLIC PANEL  TYPE AND SCREEN  ABO/RH    IMAGES: MRI L-spine 09/12/17 [facility] Care Everywhere): IMPRESSION: - In this patient with right-sided symptoms, the most significant findings are probably at L4-5. There is a central disc herniation with extension to the right foraminal to extraforaminal region. There is indentation of  the thecal sac in the central canal but no neural compression seen in the central canal. Foraminal encroachment by osteophyte in disc material could focally affect the exiting right L4 nerve. - Disc degeneration with grossly non-compressive disc protrusions/small herniations at L2-3, L3-4 and L5-S1.   EKG: EKG 10/22/17: NSR.   CV: Cardiac cath 09/16/16  [facility] Care Everywhere): Conclusions Diagnostic Procedure Summary Normal coronary arteries. Normal LV function LV ejection fraction is 60 % Diagnostic Procedure Recommendations Recommend work up for non-cardiac causes of symptoms. Medical therapy for lipid reduction  Telephone encounter by Aida RaiderBailey, Cheryl, RN [facility] Care Everywhere) state, "Advised stress test was normal." Despite normal stress echo, patient had continued chest pain, so cardiac cath pursued--normal 09/16/16. Will attempt to get a copy of the report, although [facility] is now under [facility].    Past Medical History:  Diagnosis Date  . Heart murmur 08/2016   pt unsure  . History of bronchitis a yr ago  . History of kidney stones   . History of migraine last one a month ago  . Hyperlipidemia   . Hypertension    not on any medicine, "they are watching my blood pressure"  . PONV (postoperative nausea and vomiting)     Past Surgical History:  Procedure Laterality Date  . CESAREAN SECTION     x 4  . CHOLECYSTECTOMY    . ECTOPIC PREGNANCY SURGERY     left tube removed  .  KNEE ARTHROSCOPY Left   . TONSILLECTOMY    . TUBAL LIGATION    . ULNAR COLLATERAL LIGAMENT REPAIR Left 11/15/2014   Procedure: LEFT THUMB ULNAR COLLATERAL LIGAMENT REPAIR AND OR RECONTSTRUCTION;  Surgeon: Bradly Bienenstock, MD;  Location: [facility] OR;  Service: Orthopedics;  Laterality: Left;    MEDICATIONS: . atorvastatin (LIPITOR) 80 MG tablet  . fenofibrate 54 MG tablet   . 0.9 %  sodium chloride infusion    Velna Ochs [facility] Short Stay  Center/Anesthesiology Phone [phone] 10/25/2017 12:09 PM

## 2017-10-28 ENCOUNTER — Inpatient Hospital Stay (HOSPITAL_COMMUNITY): Payer: 59 | Admitting: Physician Assistant

## 2017-10-28 ENCOUNTER — Inpatient Hospital Stay (HOSPITAL_COMMUNITY): Payer: 59 | Admitting: Anesthesiology

## 2017-10-28 ENCOUNTER — Other Ambulatory Visit: Payer: Self-pay

## 2017-10-28 ENCOUNTER — Encounter (HOSPITAL_COMMUNITY): Payer: Self-pay | Admitting: *Deleted

## 2017-10-28 ENCOUNTER — Inpatient Hospital Stay (HOSPITAL_COMMUNITY)
Admission: RE | Admit: 2017-10-28 | Discharge: 2017-10-29 | DRG: 460 | Disposition: A | Discharge status: Home / Self Care | Payer: 59 | Source: Ambulatory Visit | Attending: Orthopedic Surgery | Admitting: Orthopedic Surgery

## 2017-10-28 ENCOUNTER — Inpatient Hospital Stay (HOSPITAL_COMMUNITY): Payer: 59

## 2017-10-28 ENCOUNTER — Encounter (HOSPITAL_COMMUNITY)
Admission: RE | Disposition: A | Discharge status: Home / Self Care | Payer: Self-pay | Source: Ambulatory Visit | Attending: Orthopedic Surgery

## 2017-10-28 DIAGNOSIS — Z981 Arthrodesis status: Secondary | ICD-10-CM

## 2017-10-28 DIAGNOSIS — Z8249 Family history of ischemic heart disease and other diseases of the circulatory system: Secondary | ICD-10-CM

## 2017-10-28 DIAGNOSIS — E785 Hyperlipidemia, unspecified: Secondary | ICD-10-CM | POA: Diagnosis present

## 2017-10-28 DIAGNOSIS — Z419 Encounter for procedure for purposes other than remedying health state, unspecified: Secondary | ICD-10-CM

## 2017-10-28 DIAGNOSIS — M5116 Intervertebral disc disorders with radiculopathy, lumbar region: Secondary | ICD-10-CM | POA: Diagnosis present

## 2017-10-28 DIAGNOSIS — I10 Essential (primary) hypertension: Secondary | ICD-10-CM | POA: Diagnosis present

## 2017-10-28 DIAGNOSIS — M48061 Spinal stenosis, lumbar region without neurogenic claudication: Secondary | ICD-10-CM | POA: Diagnosis present

## 2017-10-28 HISTORY — PX: TRANSFORAMINAL LUMBAR INTERBODY FUSION (TLIF) WITH PEDICLE SCREW FIXATION 1 LEVEL: SHX6141

## 2017-10-28 LAB — POCT PREGNANCY, URINE: Preg Test, Ur: NEGATIVE

## 2017-10-28 SURGERY — TRANSFORAMINAL LUMBAR INTERBODY FUSION (TLIF) WITH PEDICLE SCREW FIXATION 1 LEVEL
Anesthesia: General

## 2017-10-28 MED ORDER — PROPOFOL 500 MG/50ML IV EMUL
INTRAVENOUS | Status: DC | PRN
  Administered 2017-10-28 (×2): 75 ug/kg/min via INTRAVENOUS

## 2017-10-28 MED ORDER — LIDOCAINE 2% (20 MG/ML) 5 ML SYRINGE
INTRAMUSCULAR | Status: DC | PRN
  Administered 2017-10-28: 80 mg via INTRAVENOUS

## 2017-10-28 MED ORDER — HEMOSTATIC AGENTS (NO CHARGE) OPTIME
TOPICAL | Status: DC | PRN
  Administered 2017-10-28: 1 via TOPICAL

## 2017-10-28 MED ORDER — FENOFIBRATE 54 MG PO TABS
54.0000 mg | ORAL_TABLET | Freq: Every day | ORAL | Status: DC
  Administered 2017-10-29: 54 mg via ORAL
  Filled 2017-10-28 (×2): qty 1

## 2017-10-28 MED ORDER — MIDAZOLAM HCL 2 MG/2ML IJ SOLN
INTRAMUSCULAR | Status: AC
  Filled 2017-10-28: qty 2

## 2017-10-28 MED ORDER — METHOCARBAMOL 500 MG PO TABS
500.0000 mg | ORAL_TABLET | Freq: Four times a day (QID) | ORAL | Status: DC | PRN
  Administered 2017-10-29 (×3): 500 mg via ORAL
  Filled 2017-10-28 (×3): qty 1

## 2017-10-28 MED ORDER — HYDROCODONE-ACETAMINOPHEN 10-325 MG PO TABS: 1.0000 | ORAL_TABLET | ORAL | 0 refills | Status: DC | PRN

## 2017-10-28 MED ORDER — CEFAZOLIN SODIUM-DEXTROSE 2-4 GM/100ML-% IV SOLN
2.0000 g | INTRAVENOUS | Status: AC
  Administered 2017-10-28: 2 g via INTRAVENOUS
  Filled 2017-10-28: qty 100

## 2017-10-28 MED ORDER — MENTHOL 3 MG MT LOZG
1.0000 | LOZENGE | OROMUCOSAL | Status: DC | PRN
  Filled 2017-10-28: qty 9

## 2017-10-28 MED ORDER — ATORVASTATIN CALCIUM 20 MG PO TABS
80.0000 mg | ORAL_TABLET | Freq: Every day | ORAL | Status: DC
  Administered 2017-10-29: 80 mg via ORAL
  Filled 2017-10-28 (×2): qty 4

## 2017-10-28 MED ORDER — THROMBIN 20000 UNITS EX SOLR
CUTANEOUS | Status: DC | PRN
  Administered 2017-10-28: 20000 [IU] via TOPICAL

## 2017-10-28 MED ORDER — MORPHINE SULFATE (PF) 4 MG/ML IV SOLN
INTRAVENOUS | Status: AC
  Administered 2017-10-28: 2 mg
  Filled 2017-10-28: qty 1

## 2017-10-28 MED ORDER — PHENYLEPHRINE 40 MCG/ML (10ML) SYRINGE FOR IV PUSH (FOR BLOOD PRESSURE SUPPORT)
PREFILLED_SYRINGE | INTRAVENOUS | Status: AC
  Filled 2017-10-28: qty 10

## 2017-10-28 MED ORDER — GLYCOPYRROLATE PF 0.2 MG/ML IJ SOSY
PREFILLED_SYRINGE | INTRAMUSCULAR | Status: AC
  Filled 2017-10-28: qty 1

## 2017-10-28 MED ORDER — PROMETHAZINE HCL 25 MG/ML IJ SOLN: 6.2500 mg | INTRAMUSCULAR | Status: DC | PRN

## 2017-10-28 MED ORDER — MORPHINE SULFATE (PF) 4 MG/ML IV SOLN
INTRAVENOUS | Status: AC
  Filled 2017-10-28: qty 1

## 2017-10-28 MED ORDER — BUPIVACAINE-EPINEPHRINE (PF) 0.25% -1:200000 IJ SOLN
INTRAMUSCULAR | Status: AC
  Filled 2017-10-28: qty 30

## 2017-10-28 MED ORDER — PROPOFOL 10 MG/ML IV BOLUS
INTRAVENOUS | Status: AC
  Filled 2017-10-28: qty 20

## 2017-10-28 MED ORDER — SODIUM CHLORIDE 0.9 % IV SOLN: 250.0000 mL | INTRAVENOUS | Status: DC

## 2017-10-28 MED ORDER — DEXAMETHASONE SODIUM PHOSPHATE 10 MG/ML IJ SOLN
INTRAMUSCULAR | Status: AC
  Filled 2017-10-28: qty 1

## 2017-10-28 MED ORDER — SUCCINYLCHOLINE CHLORIDE 200 MG/10ML IV SOSY
PREFILLED_SYRINGE | INTRAVENOUS | Status: AC
  Filled 2017-10-28: qty 10

## 2017-10-28 MED ORDER — ROCURONIUM BROMIDE 50 MG/5ML IV SOSY
PREFILLED_SYRINGE | INTRAVENOUS | Status: AC
  Filled 2017-10-28: qty 5

## 2017-10-28 MED ORDER — DOCUSATE SODIUM 100 MG PO CAPS
100.0000 mg | ORAL_CAPSULE | Freq: Two times a day (BID) | ORAL | Status: DC
  Administered 2017-10-28 – 2017-10-29 (×2): 100 mg via ORAL
  Filled 2017-10-28 (×2): qty 1

## 2017-10-28 MED ORDER — SODIUM CHLORIDE 0.9% FLUSH: 3.0000 mL | INTRAVENOUS | Status: DC | PRN

## 2017-10-28 MED ORDER — PHENOL 1.4 % MT LIQD: 1.0000 | OROMUCOSAL | Status: DC | PRN

## 2017-10-28 MED ORDER — CEFAZOLIN SODIUM 1 G IJ SOLR
INTRAMUSCULAR | Status: AC
  Filled 2017-10-28: qty 20

## 2017-10-28 MED ORDER — SUCCINYLCHOLINE CHLORIDE 200 MG/10ML IV SOSY
PREFILLED_SYRINGE | INTRAVENOUS | Status: DC | PRN
  Administered 2017-10-28: 100 mg via INTRAVENOUS

## 2017-10-28 MED ORDER — ACETAMINOPHEN 325 MG PO TABS: 650.0000 mg | ORAL_TABLET | ORAL | Status: DC | PRN

## 2017-10-28 MED ORDER — SODIUM CHLORIDE 0.9% FLUSH
3.0000 mL | Freq: Two times a day (BID) | INTRAVENOUS | Status: DC
  Administered 2017-10-28: 3 mL via INTRAVENOUS

## 2017-10-28 MED ORDER — ONDANSETRON HCL 4 MG/2ML IJ SOLN
INTRAMUSCULAR | Status: DC | PRN
  Administered 2017-10-28: 4 mg via INTRAVENOUS

## 2017-10-28 MED ORDER — GLYCOPYRROLATE PF 0.2 MG/ML IJ SOSY
PREFILLED_SYRINGE | INTRAMUSCULAR | Status: DC | PRN
  Administered 2017-10-28: .2 mg via INTRAVENOUS

## 2017-10-28 MED ORDER — THROMBIN 20000 UNITS EX KIT
PACK | CUTANEOUS | Status: AC
  Filled 2017-10-28: qty 1

## 2017-10-28 MED ORDER — FENTANYL CITRATE (PF) 250 MCG/5ML IJ SOLN
INTRAMUSCULAR | Status: AC
  Filled 2017-10-28: qty 5

## 2017-10-28 MED ORDER — ONDANSETRON HCL 4 MG PO TABS
4.0000 mg | ORAL_TABLET | Freq: Four times a day (QID) | ORAL | Status: DC | PRN
  Administered 2017-10-29: 4 mg via ORAL
  Filled 2017-10-28: qty 1

## 2017-10-28 MED ORDER — LACTATED RINGERS IV SOLN
INTRAVENOUS | Status: DC
  Administered 2017-10-28 (×2): via INTRAVENOUS

## 2017-10-28 MED ORDER — LACTATED RINGERS IV SOLN
INTRAVENOUS | Status: DC
  Administered 2017-10-28: 19:00:00 via INTRAVENOUS

## 2017-10-28 MED ORDER — MIDAZOLAM HCL 5 MG/5ML IJ SOLN
INTRAMUSCULAR | Status: DC | PRN
  Administered 2017-10-28: 2 mg via INTRAVENOUS

## 2017-10-28 MED ORDER — ONDANSETRON HCL 4 MG/2ML IJ SOLN: 4.0000 mg | Freq: Four times a day (QID) | INTRAMUSCULAR | Status: DC | PRN

## 2017-10-28 MED ORDER — BUPIVACAINE-EPINEPHRINE 0.25% -1:200000 IJ SOLN
INTRAMUSCULAR | Status: DC | PRN
  Administered 2017-10-28: 10 mL

## 2017-10-28 MED ORDER — SCOPOLAMINE 1 MG/3DAYS TD PT72
MEDICATED_PATCH | TRANSDERMAL | Status: AC
  Administered 2017-10-28: 1.5 mg
  Filled 2017-10-28: qty 1

## 2017-10-28 MED ORDER — SODIUM CHLORIDE 0.9 % IV SOLN
INTRAVENOUS | Status: DC | PRN
  Administered 2017-10-28: 25 ug/min via INTRAVENOUS

## 2017-10-28 MED ORDER — FENTANYL CITRATE (PF) 250 MCG/5ML IJ SOLN
INTRAMUSCULAR | Status: DC | PRN
  Administered 2017-10-28: 100 ug via INTRAVENOUS
  Administered 2017-10-28 (×3): 50 ug via INTRAVENOUS
  Administered 2017-10-28: 100 ug via INTRAVENOUS
  Administered 2017-10-28: 50 ug via INTRAVENOUS

## 2017-10-28 MED ORDER — METHOCARBAMOL 500 MG PO TABS: 500.0000 mg | ORAL_TABLET | Freq: Three times a day (TID) | ORAL | 0 refills | Status: DC

## 2017-10-28 MED ORDER — METOCLOPRAMIDE HCL 5 MG/ML IJ SOLN
INTRAMUSCULAR | Status: AC
  Filled 2017-10-28: qty 2

## 2017-10-28 MED ORDER — METHOCARBAMOL 1000 MG/10ML IJ SOLN
500.0000 mg | Freq: Four times a day (QID) | INTRAVENOUS | Status: DC | PRN
  Administered 2017-10-28: 500 mg via INTRAVENOUS
  Filled 2017-10-28: qty 5
  Filled 2017-10-28: qty 500

## 2017-10-28 MED ORDER — EPHEDRINE 5 MG/ML INJ
INTRAVENOUS | Status: AC
  Filled 2017-10-28: qty 10

## 2017-10-28 MED ORDER — ONDANSETRON HCL 4 MG/2ML IJ SOLN
INTRAMUSCULAR | Status: AC
  Filled 2017-10-28: qty 2

## 2017-10-28 MED ORDER — HYDROCODONE-ACETAMINOPHEN 7.5-325 MG PO TABS
ORAL_TABLET | ORAL | Status: AC
  Filled 2017-10-28: qty 1

## 2017-10-28 MED ORDER — ACETAMINOPHEN 650 MG RE SUPP: 650.0000 mg | RECTAL | Status: DC | PRN

## 2017-10-28 MED ORDER — MORPHINE SULFATE (PF) 2 MG/ML IV SOLN
1.0000 mg | INTRAVENOUS | Status: DC | PRN
  Administered 2017-10-28 (×3): 2 mg via INTRAVENOUS

## 2017-10-28 MED ORDER — METOCLOPRAMIDE HCL 5 MG/ML IJ SOLN
INTRAMUSCULAR | Status: DC | PRN
  Administered 2017-10-28: 5 mg via INTRAVENOUS

## 2017-10-28 MED ORDER — HYDROCODONE-ACETAMINOPHEN 7.5-325 MG PO TABS
1.0000 | ORAL_TABLET | ORAL | Status: DC | PRN
  Administered 2017-10-28: 1 via ORAL

## 2017-10-28 MED ORDER — LACTATED RINGERS IV SOLN
INTRAVENOUS | Status: DC | PRN
  Administered 2017-10-28: 15:00:00 via INTRAVENOUS

## 2017-10-28 MED ORDER — POLYETHYLENE GLYCOL 3350 17 G PO PACK: 17.0000 g | PACK | Freq: Every day | ORAL | Status: DC | PRN

## 2017-10-28 MED ORDER — PROPOFOL 10 MG/ML IV BOLUS
INTRAVENOUS | Status: DC | PRN
  Administered 2017-10-28: 40 mg via INTRAVENOUS
  Administered 2017-10-28: 50 mg via INTRAVENOUS
  Administered 2017-10-28: 150 mg via INTRAVENOUS

## 2017-10-28 MED ORDER — MORPHINE SULFATE (PF) 2 MG/ML IV SOLN
1.0000 mg | INTRAVENOUS | Status: DC | PRN
  Administered 2017-10-28 – 2017-10-29 (×3): 1 mg via INTRAVENOUS
  Filled 2017-10-28 (×3): qty 1

## 2017-10-28 MED ORDER — DEXAMETHASONE SODIUM PHOSPHATE 10 MG/ML IJ SOLN
INTRAMUSCULAR | Status: DC | PRN
  Administered 2017-10-28: 10 mg via INTRAVENOUS

## 2017-10-28 MED ORDER — CEFAZOLIN SODIUM-DEXTROSE 2-4 GM/100ML-% IV SOLN
2.0000 g | Freq: Three times a day (TID) | INTRAVENOUS | Status: AC
  Administered 2017-10-28 – 2017-10-29 (×2): 2 g via INTRAVENOUS
  Filled 2017-10-28 (×2): qty 100

## 2017-10-28 MED ORDER — LIDOCAINE 2% (20 MG/ML) 5 ML SYRINGE
INTRAMUSCULAR | Status: AC
  Filled 2017-10-28: qty 5

## 2017-10-28 MED ORDER — ONDANSETRON 4 MG PO TBDP: 4.0000 mg | ORAL_TABLET | Freq: Three times a day (TID) | ORAL | 0 refills | Status: AC | PRN

## 2017-10-28 MED ORDER — MAGNESIUM CITRATE PO SOLN: 1.0000 | Freq: Once | ORAL | Status: DC | PRN

## 2017-10-28 MED ORDER — SURGIFOAM 100 EX MISC
CUTANEOUS | Status: DC | PRN
  Administered 2017-10-28: 1 via TOPICAL

## 2017-10-28 MED ORDER — 0.9 % SODIUM CHLORIDE (POUR BTL) OPTIME
TOPICAL | Status: DC | PRN
  Administered 2017-10-28: 1000 mL

## 2017-10-28 MED ORDER — HYDROCODONE-ACETAMINOPHEN 10-325 MG PO TABS
2.0000 | ORAL_TABLET | ORAL | Status: DC | PRN
  Administered 2017-10-28 – 2017-10-29 (×2): 2 via ORAL
  Filled 2017-10-28 (×2): qty 2

## 2017-10-28 SURGICAL SUPPLY — 78 items
BLADE CLIPPER SURG (BLADE) IMPLANT
BUR EGG ELITE 4.0 (BURR) IMPLANT
BUR EGG ELITE 4.0MM (BURR)
CABLE BIPOLOR RESECTION CORD (MISCELLANEOUS) ×3 IMPLANT
CAGE TLIF NANOLOCK XL 9 (Cage) ×1 IMPLANT
CAGE TLIF NANOLOCK XL 9MM (Cage) ×1 IMPLANT
CANISTER SUCT 3000ML PPV (MISCELLANEOUS) ×3 IMPLANT
CLIP NEUROVISION LG (CLIP) ×4 IMPLANT
CLOSURE STERI-STRIP 1/2X4 (GAUZE/BANDAGES/DRESSINGS) ×1
CLSR STERI-STRIP ANTIMIC 1/2X4 (GAUZE/BANDAGES/DRESSINGS) ×2 IMPLANT
COVER SURGICAL LIGHT HANDLE (MISCELLANEOUS) ×3 IMPLANT
DRAPE C-ARM 42X72 X-RAY (DRAPES) ×3 IMPLANT
DRAPE C-ARMOR (DRAPES) ×3 IMPLANT
DRAPE POUCH INSTRU U-SHP 10X18 (DRAPES) ×3 IMPLANT
DRAPE SURG 17X23 STRL (DRAPES) ×3 IMPLANT
DRAPE U-SHAPE 47X51 STRL (DRAPES) ×3 IMPLANT
DRSG OPSITE POSTOP 4X6 (GAUZE/BANDAGES/DRESSINGS) ×2 IMPLANT
DRSG OPSITE POSTOP 4X8 (GAUZE/BANDAGES/DRESSINGS) ×3 IMPLANT
DURAPREP 26ML APPLICATOR (WOUND CARE) ×3 IMPLANT
ELECT BLADE 4.0 EZ CLEAN MEGAD (MISCELLANEOUS) ×3
ELECT BLADE 6.5 EXT (BLADE) IMPLANT
ELECT PENCIL ROCKER SW 15FT (MISCELLANEOUS) ×3 IMPLANT
ELECT REM PT RETURN 9FT ADLT (ELECTROSURGICAL) ×3
ELECTRODE BLDE 4.0 EZ CLN MEGD (MISCELLANEOUS) ×1 IMPLANT
ELECTRODE REM PT RTRN 9FT ADLT (ELECTROSURGICAL) ×1 IMPLANT
GLOVE BIOGEL PI IND STRL 8.5 (GLOVE) ×1 IMPLANT
GLOVE BIOGEL PI INDICATOR 8.5 (GLOVE) ×2
GLOVE SS BIOGEL STRL SZ 8.5 (GLOVE) ×1 IMPLANT
GLOVE SUPERSENSE BIOGEL SZ 8.5 (GLOVE) ×2
GOWN STRL REUS W/ TWL LRG LVL3 (GOWN DISPOSABLE) ×1 IMPLANT
GOWN STRL REUS W/TWL 2XL LVL3 (GOWN DISPOSABLE) ×6 IMPLANT
GOWN STRL REUS W/TWL LRG LVL3 (GOWN DISPOSABLE) ×3
GUIDEWIRE NITINOL BEVEL TIP (WIRE) ×8 IMPLANT
KIT BASIN OR (CUSTOM PROCEDURE TRAY) ×3 IMPLANT
KIT POSITION SURG JACKSON T1 (MISCELLANEOUS) IMPLANT
KIT TURNOVER KIT B (KITS) ×3 IMPLANT
LIGHT SOURCE ANGLE TIP STR 7FT (MISCELLANEOUS) ×4 IMPLANT
MILL MEDIUM DISP (BLADE) ×2 IMPLANT
MODULE EMG NDL SSEP NVM5 (NEEDLE) IMPLANT
MODULE EMG NEEDLE SSEP NVM5 (NEEDLE) ×3 IMPLANT
MODULE NVM5 NEXT GEN EMG (NEEDLE) ×2 IMPLANT
NDL I-PASS III (NEEDLE) IMPLANT
NDL SPNL 18GX3.5 QUINCKE PK (NEEDLE) ×2 IMPLANT
NEEDLE 22X1 1/2 (OR ONLY) (NEEDLE) ×3 IMPLANT
NEEDLE I-PASS III (NEEDLE) ×3 IMPLANT
NEEDLE SPNL 18GX3.5 QUINCKE PK (NEEDLE) ×6 IMPLANT
NS IRRIG 1000ML POUR BTL (IV SOLUTION) ×3 IMPLANT
PACK LAMINECTOMY ORTHO (CUSTOM PROCEDURE TRAY) ×3 IMPLANT
PACK UNIVERSAL I (CUSTOM PROCEDURE TRAY) ×3 IMPLANT
PAD ARMBOARD 7.5X6 YLW CONV (MISCELLANEOUS) ×6 IMPLANT
PATTIES SURGICAL .5 X.5 (GAUZE/BANDAGES/DRESSINGS) IMPLANT
PATTIES SURGICAL .5 X1 (DISPOSABLE) ×3 IMPLANT
POSITIONER HEAD PRONE TRACH (MISCELLANEOUS) ×3 IMPLANT
PROBE BALL TIP NVM5 SNG USE (BALLOONS) ×2 IMPLANT
PUTTY DBX 1CC (Putty) ×3 IMPLANT
PUTTY DBX 1CC DEPUY (Putty) IMPLANT
REDUCTION EXT RELINE MAS MOD (Neuro Prosthesis/Implant) ×6 IMPLANT
ROD RELINE MAS TI 5.5X35MM LRD (Rod) ×2 IMPLANT
ROD RELINE MAS TI LORD 5.5X40 (Rod) ×2 IMPLANT
SCREW LOCK RELINE 5.5 TULIP (Screw) ×8 IMPLANT
SCREW RELINE MAS POLY 6.5X40MM (Screw) ×4 IMPLANT
SCREW SHANK MAS MOD 6.5X40MM (Screw) ×4 IMPLANT
SHEET CONFORM 45LX20WX5H (Bone Implant) ×2 IMPLANT
SPONGE LAP 4X18 RFD (DISPOSABLE) ×6 IMPLANT
SPONGE SURGIFOAM ABS GEL 100 (HEMOSTASIS) ×3 IMPLANT
SURGIFLO W/THROMBIN 8M KIT (HEMOSTASIS) IMPLANT
SUT BONE WAX W31G (SUTURE) ×3 IMPLANT
SUT MNCRL AB 3-0 PS2 18 (SUTURE) ×6 IMPLANT
SUT VIC AB 1 CT1 18XCR BRD 8 (SUTURE) ×1 IMPLANT
SUT VIC AB 1 CT1 8-18 (SUTURE) ×6
SUT VIC AB 2-0 CT1 18 (SUTURE) ×5 IMPLANT
SYR BULB IRRIGATION 50ML (SYRINGE) ×3 IMPLANT
SYR CONTROL 10ML LL (SYRINGE) ×3 IMPLANT
TOWEL GREEN STERILE (TOWEL DISPOSABLE) ×3 IMPLANT
TOWEL GREEN STERILE FF (TOWEL DISPOSABLE) ×3 IMPLANT
TRAY FOLEY MTR SLVR 16FR STAT (SET/KITS/TRAYS/PACK) ×3 IMPLANT
WATER STERILE IRR 1000ML POUR (IV SOLUTION) ×3 IMPLANT
YANKAUER SUCT BULB TIP NO VENT (SUCTIONS) ×3 IMPLANT

## 2017-10-28 NOTE — Plan of Care (Signed)
  Problem: Safety: Goal: Ability to remain free from injury will improve Outcome: Progressing   

## 2017-10-28 NOTE — Transfer of Care (Signed)
Immediate Anesthesia Transfer of Care Note  Patient: Barbara Summers  Procedure(s) Performed: TRANSFORAMINAL LUMBAR INTERBODY FUSION (TLIF) L4-5 (N/A )  Patient Location: PACU  Anesthesia Type:General  Level of Consciousness: drowsy and patient cooperative  Airway & Oxygen Therapy: Patient Spontanous Breathing and Patient connected to nasal cannula oxygen  Post-op Assessment: Report given to RN, Post -op Vital signs reviewed and stable and Patient moving all extremities X 4  Post vital signs: Reviewed and stable  Last Vitals:  Vitals Value Taken Time  BP 115/64 10/28/2017  6:17 PM  Temp    Pulse 87 10/28/2017  6:20 PM  Resp 20 10/28/2017  6:20 PM  SpO2 94 % 10/28/2017  6:20 PM  Vitals shown include unvalidated device data.  Last Pain:  Vitals:   10/28/17 1041  TempSrc:   PainSc: 0-No pain         Complications: No apparent anesthesia complications

## 2017-10-28 NOTE — Brief Op Note (Signed)
10/28/2017  5:46 PM  PATIENT:  Misk A Mcclay  37 y.o. 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

## 2017-10-28 NOTE — Anesthesia Procedure Notes (Signed)
Procedure Name: Intubation Date/Time: 10/28/2017 2:37 PM Performed by: Waynard EdwardsSmith, Sena Hoopingarner A, CRNA Pre-anesthesia Checklist: Patient identified, Emergency Drugs available, Suction available and Patient being monitored Patient Re-evaluated:Patient Re-evaluated prior to induction Oxygen Delivery Method: Circle system utilized Preoxygenation: Pre-oxygenation with 100% oxygen Induction Type: IV induction Ventilation: Mask ventilation without difficulty Laryngoscope Size: Miller and 2 Grade View: Grade I Tube type: Oral Tube size: 7.0 mm Number of attempts: 1 Airway Equipment and Method: Stylet Placement Confirmation: ETT inserted through vocal cords under direct vision,  positive ETCO2 and breath sounds checked- equal and bilateral Secured at: 21 cm Tube secured with: Tape Dental Injury: Teeth and Oropharynx as per pre-operative assessment

## 2017-10-28 NOTE — Discharge Instructions (Signed)
Spinal Fusion, Care After °These instructions give you information about caring for yourself after your procedure. Your doctor may also give you more specific instructions. Call your doctor if you have any problems or questions after your procedure. °Follow these instructions at home: °Medicines °· Take over-the-counter and prescription medicines only as told by your doctor. These include any medicines for pain. °· Do not drive for 24 hours if you received a sedative. °· Do not drive or use heavy machinery while taking prescription pain medicine. °· If you were prescribed an antibiotic medicine, take it as told by your doctor. Do not stop taking the antibiotic even if you start to feel better. °Surgical Cut (Incision) Care °· Follow instructions from your doctor about how to take care of your surgical cut. Make sure you: °? Wash your hands with soap and water before you change your bandage (dressing). If you cannot use soap and water, use hand sanitizer. °? Change your bandage as told by your doctor. °? Leave stitches (sutures), skin glue, or skin tape (adhesive) strips in place. They may need to stay in place for 2 weeks or longer. If tape strips get loose and curl up, you may trim the loose edges. Do not remove tape strips completely unless your doctor says it is okay. °· Keep your surgical cut clean and dry. Do not take baths, swim, or use a hot tub until your doctor says it is okay. °· Check your surgical cut and the area around it every day for: °? Redness. °? Swelling. °? Fluid. °Physical Activity °· Return to your normal activities as told by your doctor. Ask your doctor what activities are safe for you. Rest and protect your back as much as you can. °· Follow instructions from your doctor about how to move. Use good posture to help your spine heal. °· Do not lift anything that is heavier than 8 lb (3.6 kg) or as told by your doctor until he or she says that it is safe. Do not lift anything over your  head. °· Do not twist or bend at the waist until your doctor says it is okay. °· Avoid pushing or pulling motions. °· Do not sit or lie down in the same position for long periods of time. °· Do not start to exercise until your doctor says it is okay. Ask your doctor what kinds of exercise you can do to make your back stronger. °General instructions °· If you were given a brace, use it as told by your doctor. °· Wear compression stockings as told by your doctor. °· Do not use tobacco products. These include cigarettes, chewing tobacco, or e-cigarettes. If you need help quitting, ask your doctor. °· Keep all follow-up visits as told by your doctor. This is important. This includes any visits with your physical therapist, if this applies. °Contact a doctor if: °· Your pain gets worse. °· Your medicine does not help your pain. °· Your legs or feet become painful or swollen. °· Your surgical cut is red, swollen, or painful. °· You have fluid, blood, or pus coming from your surgical cut. °· You feel sick to your stomach (nauseous). °· You throw up (vomit). °· Your have weakness or loss of feeling (numbness) in your legs that is new or getting worse. °· You have a fever. °· You have trouble controlling when you pee (urinate) or poop (have a bowel movement). °Get help right away if: °· Your pain is very bad. °· You have   chest pain. °· You have trouble breathing. °· You start to have a cough. °These symptoms may be an emergency. Do not wait to see if the symptoms will go away. Get medical help right away. Call your local emergency services (911 in the [place] Do not drive yourself to the hospital. °This information is not intended to replace advice given to you by your health care provider. Make sure you discuss any questions you have with your health care provider. °Document Released: 06/12/2010 Document Revised: 10/15/2015 Document Reviewed: 08/01/2014 °Elsevier Interactive Patient Education © 2018 [employer] ° °

## 2017-10-28 NOTE — Op Note (Signed)
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  Her graph postoperative diagnosis: Same  Operative procedure: Transforaminal lumbar interbody fusion L4-5  Complications: None  Intraoperative neuro monitoring: No adverse intraoperative events.  All 4 pedicle screws directly stimulated no adverse activity at greater than 40 mA.  First assistant: [name], PA  Implants:  Pedicle screws: Nuvasive MIS screws: 6.5 x 40 mm x 4.   Titan Nanolock cage: 9 mm high extra long. Graft: Autograft obtained from decompression.  Conformer allograft sheet, DBX.  Indications: This is a very pleasant 37 year old man who presented with long-standing chronic back buttock and neuropathic right leg pain.  Attempts at conservative management had failed to alleviate her symptoms.  Because of the poor quality of life and the failure to improve with appropriate conservative management we elected to move forward with surgery.  All appropriate risks benefits and alternatives were discussed with the patient and consent was obtained.  Operative report patient was brought the operating room placed by the operating room table.  After successful induction of general anesthesia and endotracheal the patient teds SCDs and a Foley were inserted.  The neuro monitoring representative then applied all the appropriate needles and pads for intraoperative neuro monitoring.  Patient was turned prone onto the Wilson frame and all bony prominences well-padded.  The back was prepped and draped in a standard fashion.  Timeout was taken confirming patient procedure and all other important data.  AP fluoroscopy views were used to localize the left L4 and L5 pedicle.  Once identified the lateral border of each pedicle I made a small stab incision and advanced the Jamshidi needle down to the lateral aspect of the facet.  Once I confirmed proper  starting position using AP fluoroscopy I connected the stimulating device to the Jamshidi needle and advanced into the pedicle.  I confirmed satisfactory trajectory in the AP plane.  As I was nearing the medial wall of the pedicle I ensure that there was no adverse free running EMG activity.  I then switched to the lateral view to confirm that I was just beyond the posterior margin of the vertebral body.  Confirming my trajectory in final position I advanced into the vertebral body.  I then placed the guidepin down the pedicle.  I repeated this exact same procedure and cannulated the left L5 pedicle.  On the contralateral side I then made a small incision along the lateral border of the pedicle and dissected down to the deep fascia.  The deep fascia was then sharply incised and I bluntly dissected down to the lateral aspect of the facet.  I placed my Jamshidi needle along the lateral aspect of the facet and using the same technique I used on the contralateral side I advanced the Jamshidi needle in using the AP view and direct neural stimulation.  Once I near the medial wall I switched to the lateral view confirmed I was beyond the posterior wall the vertebral body and advanced into the vertebral body.  I cannulated this in the L5 pedicle the same way.  I measured and inserted the pedicle screws on the right side at L4 and L5.  Connected to this where the retracting blades.  Once the screws were properly seated I directly stimulated them and there was no adverse activity at greater than 40 mA.  The medial retracting system was set up, and I could now visualize the posterior lateral  aspect of the spine.  The facet capsule was removed using Bovie and then I used an osteotome to resect the inferior L4 facet.  Once this was removed I then used a 2 and 3 mm Kerrison punch to perform a generous laminotomy of L4.  With the laminotomy completed I then resected the L4 pars.  I then dissected down through the ligamentum  flavum using a Penfield 4 until I created a plane between the ligamentum flavum and the thecal sac.  Once this was done I used my 2 and 3 mm Kerrison punch to resect the ligamentum flavum and exposed the thecal sac.  Continue to work into the lateral recess where I encountered increased spinal stenosis.  I gently mobilized the L5 nerve root and thecal sac medially until I could get a clear pink plane underneath the osteophyte of the facet.  Using my 2 mm Kerrison Roger I performed a medial facetectomy of L5 to adequately decompress the lateral recess.  I then used my osteotome to resect the large remaining superior portion of the L5 facet complex.  Is at this point I could not clearly see the posterior lateral aspect of the disc.  At this point I then traced the L5 nerve root into the foramen and performed a foraminotomy using a 3 mm Kerrison Roger.  I could now easily passed by [facility] along the L5 path and into the L5 foramen.  I was also able to palpate the inferior and medial aspect of the L5 pedicle to confirm there was no breach.  I then went superiorly remove the restraining portion of the pars until I could visualize the L4 nerve root in the foramen.  I was able to palpate the L4 pedicle and confirm again there is no breach medially or inferiorly.  At this point with the decompression complete I used neuro patties to protect the L4 and L5 nerve root and retracted the thecal sac.  An annulotomy was were performed with a 15 blade scalpel and then I used a combination of pituitary rongeurs sidecutting curettes and Kerrison Rogers to resect all the disc material.  Once I had an adequate discectomy and I was scraping subchondral bone I irrigated the wound copiously normal saline.  I then trialed with smooth paddles and elected to use the size 9 extra long intervertebral cage.  This cage was obtained and packed with the bone graft from the decompression and then sealed with DBX.  The conformer she was  contoured and placed along the anterior annulus.  I then malleted the cage into the disc space.  Once it was vertical and just beyond the posterior wall the vertebral body I then switched to an angled impactor and kicked the cage over into horizontal position.  The final resting place was in the anterior third of the disc space and horizontally position.  At this point I was very pleased with the x-rays.  The wound was copiously irrigated with normal saline and I used bipolar cautery to obtain hemostasis.  I then disconnected the retracting system and applied the polyaxial heads to the screws and then measured and placed a 35 mm rod.  I then locked it in place and torqued off the locking knots.  Once the pedicle screw rod construct on this right side was complete I then went back to the contralateral side.  I measured and placed the 40 mm length 6.5 screws at L4 and L5 over the guidepins.  Once the screws were  properly positioned I then directly stimulated them and again there was no adverse activity at greater than 40 mA.  I then obtained the 40 mm rod inserted and locked it into place.  All 4 locking nuts were torqued according manufacturer standards.  Final x-rays were then taken which were satisfactory.  Hardware and grafts were in good position there were no adverse images or events.  All wounds were copiously irrigated with normal saline and closed in a layered fashion with interrupted #1 Vicryl suture, 2-0 Vicryl suture, and 3-0 Monocryl.  Steri-Strips dry dressings were applied and the patient was ultimately extubated transfer the PACU that incident.  The end of the case all needle sponge counts were correct.

## 2017-10-28 NOTE — Anesthesia Preprocedure Evaluation (Signed)
Anesthesia Evaluation  Patient identified by MRN, date of birth, ID band Patient awake    Reviewed: Allergy & Precautions, NPO status , Patient's Chart, lab work & pertinent test results  History of Anesthesia Complications (+) PONV  Airway Mallampati: II  TM Distance: >3 FB Neck ROM: Full    Dental  (+) Dental Advisory Given   Pulmonary neg pulmonary ROS,    breath sounds clear to auscultation       Cardiovascular hypertension,  Rhythm:Regular Rate:Normal     Neuro/Psych negative neurological ROS     GI/Hepatic negative GI ROS, Neg liver ROS,   Endo/Other  negative endocrine ROS  Renal/GU negative Renal ROS     Musculoskeletal   Abdominal   Peds  Hematology negative hematology ROS (+)   Anesthesia Other Findings   Reproductive/Obstetrics                             Lab Results  Component Value Date   WBC 9.3 10/22/2017   HGB 14.8 10/22/2017   HCT 44.7 10/22/2017   MCV 92.9 10/22/2017   PLT 270 10/22/2017   Lab Results  Component Value Date   CREATININE 0.65 10/22/2017   BUN 10 10/22/2017   NA 138 10/22/2017   K 4.0 10/22/2017   CL 105 10/22/2017   CO2 22 10/22/2017    Anesthesia Physical Anesthesia Plan  ASA: II  Anesthesia Plan: General   Post-op Pain Management:    Induction: Intravenous  PONV Risk Score and Plan: 4 or greater and Ondansetron, Midazolam, Treatment may vary due to age or medical condition, Dexamethasone and Scopolamine patch - Pre-op  Airway Management Planned: Oral ETT  Additional Equipment:   Intra-op Plan:   Post-operative Plan: Extubation in OR  Informed Consent: I have reviewed the patients History and Physical, chart, labs and discussed the procedure including the risks, benefits and alternatives for the proposed anesthesia with the patient or authorized representative who has indicated his/her understanding and acceptance.   Dental  advisory given  Plan Discussed with:   Anesthesia Plan Comments:         Anesthesia Quick Evaluation

## 2017-10-29 ENCOUNTER — Encounter (HOSPITAL_COMMUNITY): Payer: Self-pay | Admitting: Orthopedic Surgery

## 2017-10-29 MED ORDER — OXYCODONE-ACETAMINOPHEN 10-325 MG PO TABS: 1.0000 | ORAL_TABLET | Freq: Four times a day (QID) | ORAL | 0 refills | Status: AC | PRN

## 2017-10-29 MED ORDER — OXYCODONE-ACETAMINOPHEN 5-325 MG PO TABS
1.0000 | ORAL_TABLET | ORAL | Status: DC | PRN
  Administered 2017-10-29 (×3): 2 via ORAL
  Filled 2017-10-29 (×3): qty 2

## 2017-10-29 NOTE — Evaluation (Signed)
Occupational Therapy Evaluation Patient Details Name: Barbara Summers MRN: [id number] DOB: 20-Nov-1980 Today's Date: 10/29/2017    History of Present Illness Pt is a 37 y.o. F s/p TLIF L4-5   Clinical Impression   This 37 y/o female presents with the above. At baseline pt is independent with ADLs and mobility. Pt demonstrating functional mobility without AD and minguardA, intermittent minA provided from spouse for added support. She currently requires setup assist for seated UB ADLs, modA for LB ADLs secondary to adhering to back precautions. Educated pt and pt's spouse regarding back precautions, AE, safety and compensatory strategies for completing ADLs while maintaining precautions with pt/pt's spouse verbalizing understanding. Pt reports spouse/family will be available to assist after return home for ADLs PRN. Education provided and questions answered throughout session. No further acute OT needs identified at this time. Will sign off.     Follow Up Recommendations  No OT follow up;Supervision/Assistance - 24 hour(24hr initially )    Equipment Recommendations  3 in 1 bedside commode           Precautions / Restrictions Precautions Precautions: Back Precaution Booklet Issued: Yes (comment) Precaution Comments: provided written handout and reviewed 3/3 back precautions  Required Braces or Orthoses: Spinal Brace Spinal Brace: Lumbar corset Restrictions Weight Bearing Restrictions: No      Mobility Bed Mobility Overal bed mobility: Needs Assistance Bed Mobility: Sit to Sidelying         Sit to sidelying: Min assist General bed mobility comments: spouse providing minA for LEs onto EOB; verbally reviewed log roll technique   Transfers Overall transfer level: Needs assistance Equipment used: 1 person hand held assist Transfers: Sit to/from Stand Sit to Stand: Min guard         General transfer comment: pt husband provided min guard for patient to transition to standing  from edge of bed    Balance Overall balance assessment: Mild deficits observed, not formally tested                                         ADL either performed or assessed with clinical judgement   ADL Overall ADL's : Needs assistance/impaired Eating/Feeding: Independent;Sitting   Grooming: Set up;Sitting   Upper Body Bathing: Min guard;Sitting   Lower Body Bathing: Minimal assistance;Sit to/from stand   Upper Body Dressing : Min guard;Set up;Sitting   Lower Body Dressing: Moderate assistance;Sit to/from stand Lower Body Dressing Details (indicate cue type and reason): educated on use of reacher for performing LB dressing with pt verbalizing understanding; reports spouse/children will assist with footwear  Toilet Transfer: Min guard;Minimal assistance;Ambulation;Regular Teacher, adult educationToilet Toilet Transfer Details (indicate cue type and reason): spouse providing minA; simulated in transfer to/from EOB  Toileting- ArchitectClothing Manipulation and Hygiene: Min guard;Sit to/from Nurse, children'sstand     Tub/Shower Transfer Details (indicate cue type and reason): educated on safe tub transfer method and recommend pt have spouse provide supervision/assist initially for increased safety; educated on use of 3:1 as shower seat  Functional mobility during ADLs: Min guard;Minimal assistance General ADL Comments: educated both pt and pt's spouse regarding back precautions, AE, brace management, safety and compensatory strategies for completing ADLs and mobility; pt reports spouse will be home 24hr for the first 5 days to assist with ADLs PRN  Pertinent Vitals/Pain Pain Assessment: Faces Faces Pain Scale: Hurts little more Pain Location: surgical site Pain Descriptors / Indicators: Operative site guarding;Grimacing Pain Intervention(s): Monitored during session     Hand Dominance     Extremity/Trunk Assessment Upper Extremity Assessment Upper Extremity Assessment:  Overall WFL for tasks assessed   Lower Extremity Assessment Lower Extremity Assessment: Defer to PT evaluation    Cervical / Trunk Assessment Cervical / Trunk Assessment: Other exceptions Cervical / Trunk Exceptions: s/p TLIF   Communication Communication Communication: No difficulties   Cognition Arousal/Alertness: Awake/alert Behavior During Therapy: WFL for tasks assessed/performed Overall Cognitive Status: Within Functional Limits for tasks assessed                                     General Comments  pt husband present throughout    Exercises Exercises: Other exercises Other Exercises Other Exercises: Instructed patient on seated marching and LAQ's for hip flexion and quadricep strengthening   Shoulder Instructions      Home Living Family/patient expects to be discharged to:: Private residence Living Arrangements: Spouse/significant other;Children Available Help at Discharge: Family Type of Home: House Home Access: Stairs to enter Secretary/administrator of Steps: 4 Entrance Stairs-Rails: Right;Left Home Layout: Bed/bath upstairs;Two level;Other (Comment)(main bedroom in basement; bathroom upstairs) Alternate Level Stairs-Number of Steps: 14 Alternate Level Stairs-Rails: Right;Left Bathroom Shower/Tub: Tub/shower unit         Home Equipment: None          Prior Functioning/Environment Level of Independence: Independent        Comments: Reports history of multiple falls within past couple months. Previously worked at [employer]: Decreased range of motion;Decreased activity tolerance;Decreased knowledge of precautions;Pain;Decreased knowledge of use of DME or AE      OT Treatment/Interventions:      OT Goals(Current goals can be found in the care plan section) Acute Rehab OT Goals Patient Stated Goal: regain independence  OT Goal Formulation: All assessment and education complete, DC therapy  OT Frequency:      Barriers to D/C:            Co-evaluation              AM-PAC PT "6 Clicks" Daily Activity     Outcome Measure Help from another person eating meals?: None Help from another person taking care of personal grooming?: None Help from another person toileting, which includes using toliet, bedpan, or urinal?: A Little Help from another person bathing (including washing, rinsing, drying)?: A Little Help from another person to put on and taking off regular upper body clothing?: None Help from another person to put on and taking off regular lower body clothing?: A Little 6 Click Score: 21   End of Session Equipment Utilized During Treatment: Back brace Nurse Communication: Mobility status  Activity Tolerance: Patient tolerated treatment well Patient left: in bed;with call bell/phone within reach;with family/visitor present  OT Visit Diagnosis: Other abnormalities of gait and mobility (R26.89)                Time: 1324-4010 OT Time Calculation (min): 21 min Charges:  OT General Charges $OT Visit: 1 Visit OT Evaluation $OT Eval Low Complexity: 1 Low  Marcy Siren, OT Pager [phone] 10/29/2017   Barbara Summers 10/29/2017, 10:21 AM

## 2017-10-29 NOTE — Anesthesia Postprocedure Evaluation (Signed)
Anesthesia Post Note  Patient: Barbara Summers  Procedure(s) Performed: TRANSFORAMINAL LUMBAR INTERBODY FUSION (TLIF) L4-5 (N/A )     Patient location during evaluation: Other Anesthesia Type: General Level of consciousness: awake and alert Pain management: pain level controlled Vital Signs Assessment: post-procedure vital signs reviewed and stable Respiratory status: spontaneous breathing, nonlabored ventilation, respiratory function stable and patient connected to nasal cannula oxygen Cardiovascular status: blood pressure returned to baseline and stable Postop Assessment: no apparent nausea or vomiting Anesthetic complications: no    Last Vitals:  Vitals:   10/29/17 0322 10/29/17 0831  BP: 114/72 (!) 109/57  Pulse: 64 (!) 55  Resp: 18 16  Temp: 37.1 C 37.1 C  SpO2: 97% 99%    Last Pain:  Vitals:   10/29/17 1140  TempSrc:   PainSc: 4                  Shelton SilvasKevin D Maeve Debord

## 2017-10-29 NOTE — Progress Notes (Signed)
Patient is discharged from room 3C10 at this time. Alert and in stable condition. IV site d/c'd and instructions read to patient and family with understanding verbalized. Left unit via wheelchair with all belongings at side.   

## 2017-10-29 NOTE — Evaluation (Signed)
Physical Therapy Evaluation Patient Details Name: Barbara JanskyKimbely A Summers MRN: [id number] DOB: Mar 31, 1980 Today's Date: 10/29/2017   History of Present Illness  Pt is a 37 y.o. F s/p TLIF L4-5  Clinical Impression  Patient evaluated by Physical Therapy with no further acute PT needs identified. Ambulating hallway distances with minimal handheld support provided by husband (more for pt comfort; no obvious balance deficits noted). Able to negotiate 10 steps with left railing with min guard assist to prepare for discharge home. Pt husband present and instructed on guarding technique. Proximal strength deficits noted; pt will likely benefit from OPPT services after initial follow up visit. All education has been completed and the patient has no further questions. PT is signing off. Thank you for this referral.     Follow Up Recommendations No PT follow up    Equipment Recommendations  3in1 (PT)    Recommendations for Other Services       Precautions / Restrictions Precautions Precautions: Back Precaution Booklet Issued: Yes (comment) Precaution Comments: patient able to recall 3/3 precautions. provided written handout Required Braces or Orthoses: Spinal Brace Spinal Brace: Lumbar corset Restrictions Weight Bearing Restrictions: No      Mobility  Bed Mobility Overal bed mobility: Modified Independent             General bed mobility comments: self cues for log roll technique  Transfers Overall transfer level: Needs assistance Equipment used: 1 person hand held assist Transfers: Sit to/from Stand Sit to Stand: Min guard         General transfer comment: pt husband provided min guard for patient to transition to standing from edge of bed  Ambulation/Gait Ambulation/Gait assistance: Min guard Gait Distance (Feet): 350 Feet Assistive device: 1 person hand held assist Gait Pattern/deviations: Step-through pattern;Narrow base of support;Decreased stride length Gait velocity:  decr Gait velocity interpretation: <1.31 ft/sec, indicative of household ambulator General Gait Details: Patient with no apparent balance deficits but seeking external support from husband. Trialed cane but pt stated it "made her feel more unstable."  Stairs Stairs: Yes Stairs assistance: Min guard Stair Management: One rail Left Number of Stairs: 10 General stair comments: step by step pattern with handheld assist provided for descent. pt husband instructed on guarding technique  Wheelchair Mobility    Modified Rankin (Stroke Patients Only)       Balance Overall balance assessment: Mild deficits observed, not formally tested                                           Pertinent Vitals/Pain Pain Assessment: Faces Faces Pain Scale: Hurts little more Pain Location: surgical site Pain Descriptors / Indicators: Operative site guarding;Grimacing Pain Intervention(s): Monitored during session    Home Living Family/patient expects to be discharged to:: Private residence Living Arrangements: Spouse/significant other;Children Available Help at Discharge: Family Type of Home: House Home Access: Stairs to enter Entrance Stairs-Rails: Doctor, general practiceight;Left Entrance Stairs-Number of Steps: 4 Home Layout: Bed/bath upstairs;Two level;Other (Comment)(main bedroom in basement; bathroom upstairs) Home Equipment: None      Prior Function Level of Independence: Independent         Comments: Reports history of multiple falls within past couple months. Previously worked at [employer]     Hand Dominance        Extremity/Trunk Assessment   Upper Extremity Assessment Upper Extremity Assessment: Overall WFL for tasks assessed    Lower  Extremity Assessment Lower Extremity Assessment: RLE deficits/detail;LLE deficits/detail RLE Deficits / Details: MMT: hip flexion 3+/5, knee extension 4/5, ankle dorsiflexion 5/5 LLE Deficits / Details: MMT: hip flexion 3+/5, knee extension  4/5, ankle dorsiflexion 5/5    Cervical / Trunk Assessment Cervical / Trunk Assessment: Other exceptions Cervical / Trunk Exceptions: s/p TLIF  Communication   Communication: No difficulties  Cognition Arousal/Alertness: Awake/alert Behavior During Therapy: WFL for tasks assessed/performed Overall Cognitive Status: Within Functional Limits for tasks assessed                                        General Comments General comments (skin integrity, edema, etc.): pt husband present throughout    Exercises Other Exercises Other Exercises: Instructed patient on seated marching and LAQ's for hip flexion and quadricep strengthening   Assessment/Plan    PT Assessment Patient needs continued PT services  PT Problem List Decreased strength;Decreased activity tolerance;Decreased balance;Decreased mobility;Pain       PT Treatment Interventions      PT Goals (Current goals can be found in the Care Plan section)  Acute Rehab PT Goals Patient Stated Goal: "work on stairs" PT Goal Formulation: All assessment and education complete, DC therapy    Frequency     Barriers to discharge        Co-evaluation               AM-PAC PT "6 Clicks" Daily Activity  Outcome Measure Difficulty turning over in bed (including adjusting bedclothes, sheets and blankets)?: None Difficulty moving from lying on back to sitting on the side of the bed? : None Difficulty sitting down on and standing up from a chair with arms (e.g., wheelchair, bedside commode, etc,.)?: A Little Help needed moving to and from a bed to chair (including a wheelchair)?: A Little Help needed walking in hospital room?: A Little Help needed climbing 3-5 steps with a railing? : A Little 6 Click Score: 20    End of Session Equipment Utilized During Treatment: Gait belt;Back brace Activity Tolerance: Patient tolerated treatment well Patient left: with call bell/phone within reach;with family/visitor  present;Other (comment)(seated EOB) Nurse Communication: Mobility status PT Visit Diagnosis: Pain;Difficulty in walking, not elsewhere classified (R26.2) Pain - part of body: (back)    Time: 1610-9604 PT Time Calculation (min) (ACUTE ONLY): 31 min   Charges:   PT Evaluation $PT Eval Low Complexity: 1 Low PT Treatments $Therapeutic Activity: 8-22 mins        Laurina Bustle, PT, DPT Acute Rehabilitation Services  Pager: [phone]   Vanetta Mulders 10/29/2017, 9:06 AM

## 2017-10-29 NOTE — Progress Notes (Signed)
    Subjective: Procedure(s) (LRB): TRANSFORAMINAL LUMBAR INTERBODY FUSION (TLIF) L4-5 (N/A) 1 Day Post-Op  Patient reports pain as 4 on 0-10 scale. Complains of muscle cramps in the LE and incisional pain. Reports decreased neuropathic leg pain reports incisional back pain   N/A void - foley just removed earlier - will monitor Negative bowel movement Positive flatus Negative chest pain or shortness of breath  Objective: Vital signs in last 24 hours: Temp:  [97.2 F (36.2 C)-98.8 F (37.1 C)] 98.8 F (37.1 C) (08/30 0322) Pulse Rate:  [63-90] 64 (08/30 0322) Resp:  [12-20] 18 (08/30 0322) BP: (94-124)/(43-72) 114/72 (08/30 0322) SpO2:  [97 %-100 %] 97 % (08/30 0322) Weight:  [78.9 kg] 78.9 kg (08/29 1036)  Intake/Output from previous day: 08/29 0701 - 08/30 0700 In: 1003 [I.V.:1003] Out: 1100 [Urine:1000; Blood:100]  Labs: No results for input(s): WBC, RBC, HCT, PLT in the last 72 hours. No results for input(s): NA, K, CL, CO2, BUN, CREATININE, GLUCOSE, CALCIUM in the last 72 hours. No results for input(s): LABPT, INR in the last 72 hours.  Physical Exam: Neurologically intact ABD soft Intact pulses distally Dorsiflexion/Plantar flexion intact Incision: dressing C/D/I and no drainage Compartment soft Body mass index is 28.08 kg/m.   Assessment/Plan: Patient stable  xrays n/a Continue mobilization with physical therapy Continue care  Advance diet Up with therapy  1. Start percocet for pain control.  If tolerated will plan on d/c with this instead of norco.  Patient had nausea along time ago after taking medication - question allergy versus ADE.   2. Mobilization with therapy today - especially working with stairs 3. Plan on d/c to home Saturday Overall doing well.  Decreased neuropathic pain in the right LE.  Muscle cramps/spasms will improve the more she ambulates and stretches.    Venita Lickahari Makilah Dowda, MD [facility] [phone]

## 2017-11-02 MED FILL — Thrombin For Soln Kit 20000 Unit: CUTANEOUS | Qty: 1 | Status: AC

## 2017-11-03 NOTE — Discharge Summary (Signed)
Physician Discharge Summary  Patient ID: Barbara Summers MRN: [id number] DOB/AGE: 1980/08/17 37 y.o.  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 Heart murmur 08/2016   pt unsure  . History of bronchitis a yr ago  . History of kidney stones   . History of migraine last one a month ago  . Hyperlipidemia   . Hypertension    not on any medicine, "they are watching my blood pressure"  . PONV (postoperative nausea and vomiting)     Surgeries: Procedure(s): TRANSFORAMINAL LUMBAR INTERBODY FUSION (TLIF) L4-5 on 10/28/2017   Consultants (if any):   Discharged Condition: Improved  Hospital Course: Barbara Summers is an 37 y.o. female who was admitted 10/28/2017 with a diagnosis of Degenerative Disc disease with RLE radiculopathy and went to the operating room on 10/28/2017 and underwent the above named procedures.  Post op day one pt reports moderate pain controlled on oral medication.  Pt reports decreased leg pain.  Pt ambulated with PT and was cleared for DC.  She was given perioperative antibiotics:  Anti-infectives (From admission, onward)   Start     Dose/Rate Route Frequency Ordered Stop   10/28/17 2030  ceFAZolin (ANCEF) IVPB 2g/100 mL premix     2 g 200 mL/hr over 30 Minutes Intravenous Every 8 hours 10/28/17 2022 10/29/17 0741   10/28/17 0956  ceFAZolin (ANCEF) IVPB 2g/100 mL premix     2 g 200 mL/hr over 30 Minutes Intravenous 30 min pre-op 10/28/17 0956 10/28/17 1455    .  She was given sequential compression devices, early ambulation, and TED for DVT prophylaxis.  She benefited maximally from the hospital stay and there were no complications.    Recent vital signs:  Vitals:   10/29/17 0322 10/29/17 0831  BP: 114/72 (!) 109/57  Pulse: 64 (!) 55  Resp: 18 16  Temp: 98.8 F (37.1  C) 98.8 F (37.1 C)  SpO2: 97% 99%    Recent laboratory studies:  Lab Results  Component Value Date   HGB 14.8 10/22/2017   HGB 15.0 05/26/2017   HGB 13.3 04/16/2017   Lab Results  Component Value Date   WBC 9.3 10/22/2017   PLT 270 10/22/2017   No results found for: INR Lab Results  Component Value Date   NA 138 10/22/2017   K 4.0 10/22/2017   CL 105 10/22/2017   CO2 22 10/22/2017   BUN 10 10/22/2017   CREATININE 0.65 10/22/2017   GLUCOSE 80 10/22/2017    Discharge Medications:   Allergies as of 10/29/2017      Reactions   Hydromorphone Hcl Nausea Only, Other (See Comments)   Hallucinations   Latex Anaphylaxis, Shortness Of Breath, Rash   Vancomycin Hives, Other (See Comments)   Blisters all over her body   Flagyl [metronidazole] Other (See Comments)   Unknown - Childhood allergy   Percocet [oxycodone-acetaminophen] Nausea And Vomiting   Tramadol Nausea And Vomiting   HEADACHE    Zithromax [azithromycin] Nausea Only      Medication List    TAKE these medications   fenofibrate 54 MG tablet Take 54 mg by mouth daily.   LIPITOR 80 MG tablet Generic drug:  atorvastatin Take 80 mg by mouth daily.   methocarbamol 500 MG tablet Commonly known as:  ROBAXIN Take 1 tablet (500 mg  total) by mouth 3 (three) times daily.   ondansetron 4 MG disintegrating tablet Commonly known as:  ZOFRAN-ODT Take 1 tablet (4 mg total) by mouth every 8 (eight) hours as needed for nausea or vomiting.   oxyCODONE-acetaminophen 10-325 MG tablet Commonly known as:  PERCOCET Take 1 tablet by mouth every 6 (six) hours as needed for pain.       Diagnostic Studies: Dg Lumbar Spine 2-3 Views  Result Date: 10/28/2017 CLINICAL DATA:  L4-5 TLIF. EXAM: LUMBAR SPINE - 2-3 VIEW; DG C-ARM 61-120 MIN FLUOROSCOPY TIME:  200 seconds. COMPARISON:  Lumbar spine radiographs October 25, 2017 FINDINGS: Three intraoperative fluoroscopic spot views of the lumbar spine. Interpreting radiologist was not  present at time of operation. L4-5 pedicle screws and interbody prosthesis. IMPRESSION: Intraoperative imaging of L4-5 TLIF. Electronically Signed   By: Awilda Metro M.D.   On: 10/28/2017 19:46   Dg C-arm 1-60 Min  Result Date: 10/28/2017 CLINICAL DATA:  L4-5 TLIF. EXAM: LUMBAR SPINE - 2-3 VIEW; DG C-ARM 61-120 MIN FLUOROSCOPY TIME:  200 seconds. COMPARISON:  Lumbar spine radiographs October 25, 2017 FINDINGS: Three intraoperative fluoroscopic spot views of the lumbar spine. Interpreting radiologist was not present at time of operation. L4-5 pedicle screws and interbody prosthesis. IMPRESSION: Intraoperative imaging of L4-5 TLIF. Electronically Signed   By: Awilda Metro M.D.   On: 10/28/2017 19:46   Dg C-arm 1-60 Min  Result Date: 10/28/2017 CLINICAL DATA:  L4-5 TLIF. EXAM: LUMBAR SPINE - 2-3 VIEW; DG C-ARM 61-120 MIN FLUOROSCOPY TIME:  200 seconds. COMPARISON:  Lumbar spine radiographs October 25, 2017 FINDINGS: Three intraoperative fluoroscopic spot views of the lumbar spine. Interpreting radiologist was not present at time of operation. L4-5 pedicle screws and interbody prosthesis. IMPRESSION: Intraoperative imaging of L4-5 TLIF. Electronically Signed   By: Awilda Metro M.D.   On: 10/28/2017 19:46    Disposition:  Post op meds provided Pt will present to clinic in 2 weeks Discharge Instructions    Incentive spirometry RT   Complete by:  As directed       Follow-up Information    [name], MD Follow up in 2 week(s).   Specialty:  Orthopedic Surgery Contact information: [address] [place] [address] [place] [address] [phone]            Signed: Kirt Boys 11/03/2017, 10:24 AM

## 2017-11-10 ENCOUNTER — Emergency Department (HOSPITAL_BASED_OUTPATIENT_CLINIC_OR_DEPARTMENT_OTHER): Payer: 59

## 2017-11-10 ENCOUNTER — Encounter (HOSPITAL_BASED_OUTPATIENT_CLINIC_OR_DEPARTMENT_OTHER): Payer: Self-pay

## 2017-11-10 ENCOUNTER — Emergency Department (HOSPITAL_BASED_OUTPATIENT_CLINIC_OR_DEPARTMENT_OTHER)
Admission: EM | Admit: 2017-11-10 | Discharge: 2017-11-10 | Disposition: A | Discharge status: Home / Self Care | Payer: 59 | Attending: Emergency Medicine | Admitting: Emergency Medicine

## 2017-11-10 ENCOUNTER — Other Ambulatory Visit: Payer: Self-pay

## 2017-11-10 DIAGNOSIS — Z9049 Acquired absence of other specified parts of digestive tract: Secondary | ICD-10-CM | POA: Insufficient documentation

## 2017-11-10 DIAGNOSIS — Z91040 Latex allergy status: Secondary | ICD-10-CM | POA: Diagnosis not present

## 2017-11-10 DIAGNOSIS — I10 Essential (primary) hypertension: Secondary | ICD-10-CM | POA: Diagnosis not present

## 2017-11-10 DIAGNOSIS — Z79899 Other long term (current) drug therapy: Secondary | ICD-10-CM | POA: Diagnosis not present

## 2017-11-10 DIAGNOSIS — R509 Fever, unspecified: Secondary | ICD-10-CM | POA: Diagnosis present

## 2017-11-10 DIAGNOSIS — R1084 Generalized abdominal pain: Secondary | ICD-10-CM | POA: Diagnosis not present

## 2017-11-10 LAB — URINALYSIS, ROUTINE W REFLEX MICROSCOPIC
Glucose, UA: NEGATIVE mg/dL
KETONES UR: 15 mg/dL — AB
Leukocytes, UA: NEGATIVE
Nitrite: NEGATIVE
PROTEIN: NEGATIVE mg/dL
Specific Gravity, Urine: >1.03 — ABNORMAL HIGH (ref 1.005–1.030)
pH: 5 (ref 5.0–8.0)

## 2017-11-10 LAB — COMPREHENSIVE METABOLIC PANEL
ALT: 33 U/L (ref 0–44)
AST: 18 U/L (ref 15–41)
Albumin: 4.5 g/dL (ref 3.5–5.0)
Alkaline Phosphatase: 99 U/L (ref 38–126)
Anion gap: 11 (ref 5–15)
BILIRUBIN TOTAL: 1.1 mg/dL (ref 0.3–1.2)
BUN: 12 mg/dL (ref 6–20)
CHLORIDE: 102 mmol/L (ref 98–111)
CO2: 26 mmol/L (ref 22–32)
CREATININE: 0.66 mg/dL (ref 0.44–1.00)
Calcium: 9.8 mg/dL (ref 8.9–10.3)
GFR calc Af Amer: >60 mL/min (ref >60)
Glucose, Bld: 88 mg/dL (ref 70–99)
Potassium: 4 mmol/L (ref 3.5–5.1)
Sodium: 139 mmol/L (ref 135–145)
Total Protein: 8 g/dL (ref 6.5–8.1)

## 2017-11-10 LAB — CBC WITH DIFFERENTIAL/PLATELET
Basophils Absolute: 0 10*3/uL (ref 0.0–0.1)
Basophils Relative: 0 %
EOS PCT: 1 %
Eosinophils Absolute: 0.1 10*3/uL (ref 0.0–0.7)
HCT: 40.7 % (ref 36.0–46.0)
Hemoglobin: 14 g/dL (ref 12.0–15.0)
LYMPHS ABS: 2.7 10*3/uL (ref 0.7–4.0)
Lymphocytes Relative: 27 %
MCH: 31.4 pg (ref 26.0–34.0)
MCHC: 34.4 g/dL (ref 30.0–36.0)
MCV: 91.3 fL (ref 78.0–100.0)
Monocytes Absolute: 0.6 10*3/uL (ref 0.1–1.0)
Monocytes Relative: 6 %
Neutro Abs: 6.6 10*3/uL (ref 1.7–7.7)
Neutrophils Relative %: 66 %
Platelets: 376 10*3/uL (ref 150–400)
RBC: 4.46 MIL/uL (ref 3.87–5.11)
RDW: 13.1 % (ref 11.5–15.5)
WBC: 10.1 10*3/uL (ref 4.0–10.5)

## 2017-11-10 LAB — I-STAT CG4 LACTIC ACID, ED: LACTIC ACID, VENOUS: 0.58 mmol/L (ref 0.5–1.9)

## 2017-11-10 LAB — URINALYSIS, MICROSCOPIC (REFLEX)

## 2017-11-10 LAB — PREGNANCY, URINE: PREG TEST UR: NEGATIVE

## 2017-11-10 MED ORDER — SODIUM CHLORIDE 0.9 % IV BOLUS
1000.0000 mL | Freq: Once | INTRAVENOUS | Status: AC
  Administered 2017-11-10: 1000 mL via INTRAVENOUS

## 2017-11-10 MED ORDER — LOPERAMIDE HCL 2 MG PO CAPS: 2.0000 mg | ORAL_CAPSULE | ORAL | 0 refills | Status: AC | PRN

## 2017-11-10 MED ORDER — DICYCLOMINE HCL 20 MG PO TABS: 20.0000 mg | ORAL_TABLET | Freq: Three times a day (TID) | ORAL | 0 refills | Status: AC

## 2017-11-10 MED ORDER — ONDANSETRON HCL 4 MG PO TABS: 4.0000 mg | ORAL_TABLET | Freq: Four times a day (QID) | ORAL | 0 refills | Status: AC

## 2017-11-10 MED ORDER — IOPAMIDOL (ISOVUE-300) INJECTION 61%
100.0000 mL | Freq: Once | INTRAVENOUS | Status: AC | PRN
  Administered 2017-11-10: 100 mL via INTRAVENOUS

## 2017-11-10 NOTE — ED Triage Notes (Signed)
C/o fever every day since 8/29 after back surgery was sen by surgical PA last week-NAD-steady slow gait

## 2017-11-10 NOTE — Discharge Instructions (Addendum)
Your lab work and urine were normal today. Your CT scan showed signs of colitis.   I have prescribed you the following medication: Bentyl, for abdominal cramping Zofran, for nausea Imodium, for diarrhea  Please follow-up with your PCP or surgeon for further concerns regarding your post-op.  Thank you for allowing [place] to take care of you today.

## 2017-11-10 NOTE — ED Provider Notes (Addendum)
[facility] EMERGENCY DEPARTMENT Provider Note   CSN: [id number] Arrival date & time: 11/10/17  1721     History   Chief Complaint Chief Complaint  Patient presents with  . Fever    HPI Barbara Summers is a 37 y.o. female.  37 year old female presents with complaint of fevers (max temp 99.5) with chills, sweats, nausea, vomiting, abdominal pain, diarrhea.  Patient states that symptoms started 2 weeks ago after she had a lumbar fusion and decompression surgery.  Patient has followed up with her neurosurgeons office and seems to be healing well.  Patient was given antibiotics during surgery, no other antibiotic use in the past month, no known sick contacts.  No other complaints or concerns.     Past Medical History:  Diagnosis Date  . Heart murmur 08/2016   pt unsure  . History of bronchitis a yr ago  . History of kidney stones   . History of migraine last one a month ago  . Hyperlipidemia   . Hypertension    not on any medicine, "they are watching my blood pressure"  . PONV (postoperative nausea and vomiting)     Patient Active Problem List   Diagnosis Date Noted  . S/P lumbar fusion 10/28/2017  . SHOULDER PAIN, LEFT 12/05/2009  . ADHESIVE CAPSULITIS, LEFT 12/05/2009    Past Surgical History:  Procedure Laterality Date  . BACK SURGERY    . CESAREAN SECTION     x 4  . CHOLECYSTECTOMY    . ECTOPIC PREGNANCY SURGERY     left tube removed  . KNEE ARTHROSCOPY Left   . TONSILLECTOMY    . TRANSFORAMINAL LUMBAR INTERBODY FUSION (TLIF) WITH PEDICLE SCREW FIXATION 1 LEVEL N/A 10/28/2017   Procedure: TRANSFORAMINAL LUMBAR INTERBODY FUSION (TLIF) L4-5;  Surgeon: [name], MD;  Location: [facility] OR;  Service: Orthopedics;  Laterality: N/A;  . TUBAL LIGATION    . ULNAR COLLATERAL LIGAMENT REPAIR Left 11/15/2014   Procedure: LEFT THUMB ULNAR COLLATERAL LIGAMENT REPAIR AND OR RECONTSTRUCTION;  Surgeon: Bradly Bienenstock, MD;  Location: [facility] OR;  Service: Orthopedics;   Laterality: Left;     OB History   None      Home Medications    Prior to Admission medications   Medication Sig Start Date End Date Taking? Authorizing Provider  atorvastatin (LIPITOR) 80 MG tablet Take 80 mg by mouth daily.     [provider]  dicyclomine (BENTYL) 20 MG tablet Take 1 tablet (20 mg total) by mouth 4 (four) times daily -  before meals and at bedtime for 10 days. 11/10/17 11/20/17  Mortis, Jerrel Ivory I, PA-C  fenofibrate 54 MG tablet Take 54 mg by mouth daily. 01/14/17   [provider]  loperamide (IMODIUM) 2 MG capsule Take 1 capsule (2 mg total) by mouth as needed for up to 7 days for diarrhea or loose stools. 11/10/17 11/17/17  Mortis, Jerrel Ivory I, PA-C  methocarbamol (ROBAXIN) 500 MG tablet Take 1 tablet (500 mg total) by mouth 3 (three) times daily. 10/28/17   Mayo, Baxter Kail, PA-C  ondansetron (ZOFRAN ODT) 4 MG disintegrating tablet Take 1 tablet (4 mg total) by mouth every 8 (eight) hours as needed for nausea or vomiting. 10/28/17   Mayo, Baxter Kail, PA-C  ondansetron (ZOFRAN) 4 MG tablet Take 1 tablet (4 mg total) by mouth every 6 (six) hours for 7 days. 11/10/17 11/17/17  Mortis, Sharyon Medicus, PA-C  oxyCODONE-acetaminophen (PERCOCET) 10-325 MG tablet Take 1 tablet by mouth every 6 (six)  hours as needed for pain. 10/29/17 10/29/18  Mayo, Baxter Kail, PA-C    Family History Family History  Problem Relation Age of Onset  . Heart disease Father   . Heart disease Maternal Grandmother   . Heart disease Paternal Grandfather   . Heart disease Maternal Aunt   . Colon polyps Maternal Aunt   . Colon cancer Neg Hx   . Esophageal cancer Neg Hx   . Pancreatic cancer Neg Hx   . Stomach cancer Neg Hx     Social History Social History   Tobacco Use  . Smoking status: Never Smoker  . Smokeless tobacco: Never Used  Substance Use Topics  . Alcohol use: Yes    Comment: occasionally  . Drug use: No     Allergies   Hydromorphone hcl;  Latex; Vancomycin; Flagyl [metronidazole]; Percocet [oxycodone-acetaminophen]; Tramadol; and Zithromax [azithromycin]   Review of Systems Review of Systems  Constitutional: Positive for chills, diaphoresis, fatigue and fever.  Respiratory: Negative for shortness of breath.   Cardiovascular: Negative for chest pain.  Gastrointestinal: Positive for abdominal pain, diarrhea, nausea and vomiting. Negative for abdominal distention and blood in stool.  Genitourinary: Positive for decreased urine volume. Negative for dysuria, frequency and urgency.  Musculoskeletal: Positive for back pain.  Skin: Negative for color change.  Allergic/Immunologic: Negative for immunocompromised state.  Neurological: Negative for weakness and numbness.  Hematological: Negative for adenopathy. Does not bruise/bleed easily.  Psychiatric/Behavioral: Negative for confusion.  All other systems reviewed and are negative.    Physical Exam Updated Vital Signs BP 139/89   Pulse 95   Temp 99 F (37.2 C)   Resp 20   Ht 5\' 6"  (1.676 m)   Wt 78 kg   LMP 10/31/2017   SpO2 100%   BMI 27.76 kg/m   Physical Exam  Constitutional: She is oriented to person, place, and time. She appears well-developed and well-nourished. No distress.  HENT:  Head: Normocephalic and atraumatic.  Eyes: Conjunctivae are normal.  Cardiovascular: Normal rate, regular rhythm, normal heart sounds and intact distal pulses.  No murmur heard. Pulmonary/Chest: Effort normal and breath sounds normal. No respiratory distress.  Abdominal: Soft. Bowel sounds are normal. She exhibits no distension. There is tenderness in the right lower quadrant and left lower quadrant.  Musculoskeletal: She exhibits tenderness. She exhibits no deformity.       Back:  Neurological: She is alert and oriented to person, place, and time. No sensory deficit. She displays no Babinski's sign on the right side. She displays no Babinski's sign on the left side.  Reflex  Scores:      Patellar reflexes are 2+ on the right side and 2+ on the left side. Skin: Skin is warm and dry. No rash noted. She is not diaphoretic.  Psychiatric: She has a normal mood and affect. Her behavior is normal.  Nursing note and vitals reviewed.    ED Treatments / Results  Labs (all labs ordered are listed, but only abnormal results are displayed) Labs Reviewed  URINALYSIS, ROUTINE W REFLEX MICROSCOPIC - Abnormal; Notable for the following components:      Result Value   Specific Gravity, Urine >1.030 (*)    Hgb urine dipstick SMALL (*)    Bilirubin Urine SMALL (*)    Ketones, ur 15 (*)    All other components within normal limits  URINALYSIS, MICROSCOPIC (REFLEX) - Abnormal; Notable for the following components:   Bacteria, UA MANY (*)    All other components within normal  limits  GASTROINTESTINAL PANEL BY PCR, STOOL (REPLACES STOOL CULTURE)  URINE CULTURE  COMPREHENSIVE METABOLIC PANEL  CBC WITH DIFFERENTIAL/PLATELET  PREGNANCY, URINE  I-STAT CG4 LACTIC ACID, ED    EKG None  Radiology Dg Chest 2 View  Result Date: 11/10/2017 CLINICAL DATA:  Nausea, vomiting, diarrhea for 2 weeks. EXAM: CHEST - 2 VIEW COMPARISON:  October 13, 2016 FINDINGS: The heart size and mediastinal contours are within normal limits. Both lungs are clear. The visualized skeletal structures are stable. IMPRESSION: No active cardiopulmonary disease. Electronically Signed   By: Sherian Rein M.D.   On: 11/10/2017 18:50   Ct Abdomen Pelvis W Contrast  Result Date: 11/10/2017 CLINICAL DATA:  Fever, chills, nausea, vomiting, abdominal pain and diarrhea. Patient reports symptom onset 2 weeks ago after lumbar fusion. EXAM: CT ABDOMEN AND PELVIS WITH CONTRAST TECHNIQUE: Multidetector CT imaging of the abdomen and pelvis was performed using the standard protocol following bolus administration of intravenous contrast. CONTRAST:  ISOVUE-300 IOPAMIDOL (ISOVUE-300) INJECTION 61% COMPARISON:   Abdominopelvic CT 05/26/2017 FINDINGS: Lower chest: Subsegmental atelectasis at the left lung base. No pleural fluid. No confluent consolidation. Hepatobiliary: Unchanged hepatic cysts largest measuring 19 mm in the periphery of the right lobe. No new hepatic lesion. Clips in the gallbladder fossa postcholecystectomy. No biliary dilatation. Pancreas: No ductal dilatation or inflammation. Spleen: Normal in size without focal abnormality. Adrenals/Urinary Tract: Normal adrenal glands. No hydronephrosis or perinephric edema. Extrarenal pelvis configuration of the right kidney again seen. Homogeneous renal enhancement with symmetric excretion on delayed phase imaging. Urinary bladder is nondistended and unremarkable. Stomach/Bowel: Administered enteric contrast reaches the rectum. Borderline wall thickening of moderate length segment of sigmoid colon. No perienteric stranding or inflammation. No additional bowel wall thickening or evidence of inflammation. Normal appendix. Stomach is unremarkable. Vascular/Lymphatic: No abdominopelvic adenopathy. Incidental circumaortic left renal vein. No acute vascular findings. Reproductive: Uterus and bilateral adnexa are unremarkable. Nabothian cysts in the cervix. Other: Previous free fluid in the pelvis has resolved. No free air, free fluid or ascites. No intra-abdominal abscess. Musculoskeletal: Posterior L4-L5 fusion with interbody spacer. No hardware complication. No bony destructive change or endplate irregularities to suggest osteomyelitis or discitis. No fluid collection in the paraspinal soft tissues. IMPRESSION: 1. Borderline mild sigmoid colonic wall thickening, which in the setting of diarrhea may represent mild colitis. No pericolonic inflammation. 2. No other acute findings in the abdomen or pelvis. 3. Posterior L4-L5 fusion without evident complication. If there is clinical concern for epidural abscess or spinal infection, MRI recommended. Electronically Signed   By:  Narda Rutherford M.D.   On: 11/10/2017 21:12    Procedures Procedures (including critical care time)  Medications Ordered in ED Medications  sodium chloride 0.9 % bolus 1,000 mL ( Intravenous Stopped 11/10/17 1937)  iopamidol (ISOVUE-300) 61 % injection 100 mL (100 mLs Intravenous Contrast Given 11/10/17 2027)     Initial Impression / Assessment and Plan / ED Course  I have reviewed the triage vital signs and the nursing notes.  Pertinent labs & imaging results that were available during my care of the patient were reviewed by me and considered in my medical decision making (see chart for details).  Clinical Course as of Nov 12 1807  Wed Nov 10, 2017  1954 Care signed out to [employer], PA-C at shift change pending CT abdomen and pelvis.  Review of labs- normal CBC, CMP, lactic acid, upreg negative. UA with many bacteria- sent for cx.    [LM]  1956 Case discussed  with Army Melia, PA-C. Infectious/fever work-up so far has been unremarkable. Awaiting CT abdomen/pelvis results. Plan to discharge if no acute findings.   [GM]  2130 CT abdomen resulted showed some colonic thickening suggesting colitis. No acute intra-abdominal or GU processes.   [GM]    Clinical Course User Index [GM] Mortis, Sharyon Medicus, PA-C [LM] Jeannie Fend, PA-C    Final Clinical Impressions(s) / ED Diagnoses   Final diagnoses:  Generalized abdominal pain    ED Discharge Orders         Ordered    dicyclomine (BENTYL) 20 MG tablet  3 times daily before meals & bedtime     11/10/17 2156    ondansetron (ZOFRAN) 4 MG tablet  Every 6 hours     11/10/17 2156    loperamide (IMODIUM) 2 MG capsule  As needed     11/10/17 2156           Jeannie Fend, PA-C 11/10/17 1958    Jeannie Fend, PA-C 11/11/17 1809    Terrilee Files, MD 11/12/17 1053

## 2017-11-10 NOTE — ED Provider Notes (Addendum)
  ED Course/Procedures   Clinical Course as of Nov 12 1806  Wed Nov 10, 2017  1954 Care signed out to [employer], PA-C at shift change pending CT abdomen and pelvis.  Review of labs- normal CBC, CMP, lactic acid, upreg negative. UA with many bacteria- sent for cx.    [LM]  1956 Case discussed with Army Melia, PA-C. Infectious/fever work-up so far has been unremarkable. Awaiting CT abdomen/pelvis results. Plan to discharge if no acute findings.   [GM]  2130 CT abdomen resulted showed some colonic thickening suggesting colitis. No acute intra-abdominal or GU processes.   [GM]    Clinical Course User Index [GM] Mortis, Sharyon Medicus, PA-C [LM] Barbara Fend, PA-C    Procedures  MDM  Barbara Summers is a 38 yo female with a history of migraines, HTN, HLD and nephrolithiasis who presents to the ED for fever x2 weeks and correlates to her recent spinal surgery. Patient's max reported temp was 99.67F which is not a fever; however, patient continues to be concerned and presented to the ED for further evaluation for etiology to complaints. Initial lab work and UA ordered by PA Eulah Pont has been unremarkable. CT abdomen ordered which suggests colitis and is a valid explanation for patient's diarrhea and abdominal complaints. She remains hemodynamically stable and can be discharged as planned.  Dispo: Home. After thorough clinical evaluation, this patient is determined to be medically stable and can be safely discharged with the previously mentioned treatment and/or outpatient follow-up/referral(s). At this time, there are no other apparent medical conditions that require further screening, evaluation or treatment.       Barbara Summers 11/10/17 2154    Terrilee Files, MD 11/11/17 1447    Barbara Fend, PA-C 11/11/17 1808    Terrilee Files, MD 11/12/17 1054

## 2017-11-12 LAB — URINE CULTURE: Culture: NO GROWTH

## 2018-01-04 ENCOUNTER — Ambulatory Visit: Payer: 59 | Attending: Orthopedic Surgery | Admitting: Physical Therapy

## 2018-09-12 ENCOUNTER — Telehealth: Payer: Self-pay | Admitting: Internal Medicine

## 2018-09-12 NOTE — Telephone Encounter (Signed)
Pt called with chat bot and would like to be scheduled for covid testing.    Pt has had espouser to covid 19  Pt states that she has a sore throat/ear pain/chest tightness.

## 2018-09-12 NOTE — Telephone Encounter (Signed)
Contacted pt, asked her does she have a primary care provider. She states she has not been seen by her primary care office in a year and who she seen was Halford Chessman but she is no longer within [facility]. Offered pt two options to reach out to her previous pcp's office to see about obtaining an order from them or contact the health department. Pt states she will try her pcp's office first and then go from there. Nothing further is needed at this time.

## 2020-03-31 ENCOUNTER — Emergency Department (HOSPITAL_COMMUNITY): Payer: 59

## 2020-03-31 ENCOUNTER — Emergency Department (HOSPITAL_COMMUNITY)
Admission: EM | Admit: 2020-03-31 | Discharge: 2020-04-01 | Disposition: A | Discharge status: Home / Self Care | Payer: 59 | Attending: Emergency Medicine | Admitting: Emergency Medicine

## 2020-03-31 ENCOUNTER — Encounter (HOSPITAL_COMMUNITY): Payer: Self-pay | Admitting: Emergency Medicine

## 2020-03-31 ENCOUNTER — Other Ambulatory Visit: Payer: Self-pay

## 2020-03-31 DIAGNOSIS — M25552 Pain in left hip: Secondary | ICD-10-CM | POA: Insufficient documentation

## 2020-03-31 DIAGNOSIS — Z79899 Other long term (current) drug therapy: Secondary | ICD-10-CM | POA: Diagnosis not present

## 2020-03-31 DIAGNOSIS — Y92410 Unspecified street and highway as the place of occurrence of the external cause: Secondary | ICD-10-CM | POA: Diagnosis not present

## 2020-03-31 DIAGNOSIS — I10 Essential (primary) hypertension: Secondary | ICD-10-CM | POA: Diagnosis not present

## 2020-03-31 DIAGNOSIS — S0083XA Contusion of other part of head, initial encounter: Secondary | ICD-10-CM | POA: Diagnosis not present

## 2020-03-31 DIAGNOSIS — R52 Pain, unspecified: Secondary | ICD-10-CM

## 2020-03-31 DIAGNOSIS — S0990XA Unspecified injury of head, initial encounter: Secondary | ICD-10-CM | POA: Diagnosis present

## 2020-03-31 DIAGNOSIS — Z91040 Latex allergy status: Secondary | ICD-10-CM | POA: Insufficient documentation

## 2020-03-31 LAB — COMPREHENSIVE METABOLIC PANEL
ALT: 44 U/L (ref 0–44)
AST: 60 U/L — ABNORMAL HIGH (ref 15–41)
Albumin: 3.8 g/dL (ref 3.5–5.0)
Alkaline Phosphatase: 46 U/L (ref 38–126)
Anion gap: 7 (ref 5–15)
BUN: 6 mg/dL (ref 6–20)
CO2: 23 mmol/L (ref 22–32)
Calcium: 8.8 mg/dL — ABNORMAL LOW (ref 8.9–10.3)
Chloride: 109 mmol/L (ref 98–111)
Creatinine, Ser: 0.64 mg/dL (ref 0.44–1.00)
GFR, Estimated: >60 mL/min (ref >60)
Glucose, Bld: 94 mg/dL (ref 70–99)
Potassium: 3.6 mmol/L (ref 3.5–5.1)
Sodium: 139 mmol/L (ref 135–145)
Total Bilirubin: 0.8 mg/dL (ref 0.3–1.2)
Total Protein: 6.4 g/dL — ABNORMAL LOW (ref 6.5–8.1)

## 2020-03-31 LAB — CBC WITH DIFFERENTIAL/PLATELET
Abs Immature Granulocytes: 0.05 10*3/uL (ref 0.00–0.07)
Basophils Absolute: 0 10*3/uL (ref 0.0–0.1)
Basophils Relative: 0 %
Eosinophils Absolute: 0 10*3/uL (ref 0.0–0.5)
Eosinophils Relative: 0 %
HCT: 36.9 % (ref 36.0–46.0)
Hemoglobin: 12.3 g/dL (ref 12.0–15.0)
Immature Granulocytes: 0 %
Lymphocytes Relative: 12 %
Lymphs Abs: 1.7 10*3/uL (ref 0.7–4.0)
MCH: 31.3 pg (ref 26.0–34.0)
MCHC: 33.3 g/dL (ref 30.0–36.0)
MCV: 93.9 fL (ref 80.0–100.0)
Monocytes Absolute: 0.6 10*3/uL (ref 0.1–1.0)
Monocytes Relative: 4 %
Neutro Abs: 11.4 10*3/uL — ABNORMAL HIGH (ref 1.7–7.7)
Neutrophils Relative %: 84 %
Platelets: 215 10*3/uL (ref 150–400)
RBC: 3.93 MIL/uL (ref 3.87–5.11)
RDW: 12.9 % (ref 11.5–15.5)
WBC: 13.7 10*3/uL — ABNORMAL HIGH (ref 4.0–10.5)
nRBC: 0 % (ref 0.0–0.2)

## 2020-03-31 LAB — I-STAT CHEM 8, ED
BUN: 6 mg/dL (ref 6–20)
Calcium, Ion: 1.11 mmol/L — ABNORMAL LOW (ref 1.15–1.40)
Chloride: 106 mmol/L (ref 98–111)
Creatinine, Ser: 0.6 mg/dL (ref 0.44–1.00)
Glucose, Bld: 90 mg/dL (ref 70–99)
HCT: 40 % (ref 36.0–46.0)
Hemoglobin: 13.6 g/dL (ref 12.0–15.0)
Potassium: 3.8 mmol/L (ref 3.5–5.1)
Sodium: 141 mmol/L (ref 135–145)
TCO2: 24 mmol/L (ref 22–32)

## 2020-03-31 LAB — I-STAT BETA HCG BLOOD, ED (MC, WL, AP ONLY): I-stat hCG, quantitative: <5 m[IU]/mL (ref <5)

## 2020-03-31 MED ORDER — LORAZEPAM 2 MG/ML IJ SOLN
0.5000 mg | Freq: Once | INTRAMUSCULAR | Status: AC
  Administered 2020-03-31: 0.5 mg via INTRAVENOUS
  Filled 2020-03-31: qty 1

## 2020-03-31 MED ORDER — IOHEXOL 350 MG/ML SOLN
75.0000 mL | Freq: Once | INTRAVENOUS | Status: AC | PRN
  Administered 2020-03-31: 75 mL via INTRAVENOUS

## 2020-03-31 MED ORDER — ONDANSETRON HCL 4 MG/2ML IJ SOLN
4.0000 mg | Freq: Once | INTRAMUSCULAR | Status: AC
  Administered 2020-03-31: 4 mg via INTRAVENOUS
  Filled 2020-03-31: qty 2

## 2020-03-31 MED ORDER — MORPHINE SULFATE (PF) 4 MG/ML IV SOLN
4.0000 mg | Freq: Once | INTRAVENOUS | Status: AC
  Administered 2020-03-31: 4 mg via INTRAVENOUS
  Filled 2020-03-31: qty 1

## 2020-03-31 MED ORDER — NAPROXEN 375 MG PO TABS: 375.0000 mg | ORAL_TABLET | Freq: Two times a day (BID) | ORAL | 0 refills | Status: AC

## 2020-03-31 MED ORDER — CYCLOBENZAPRINE HCL 10 MG PO TABS: 10.0000 mg | ORAL_TABLET | Freq: Three times a day (TID) | ORAL | 0 refills | Status: AC

## 2020-03-31 MED ORDER — FENTANYL CITRATE (PF) 100 MCG/2ML IJ SOLN
100.0000 ug | Freq: Once | INTRAMUSCULAR | Status: AC
  Administered 2020-03-31: 100 ug via INTRAVENOUS
  Filled 2020-03-31: qty 2

## 2020-03-31 MED ORDER — IOHEXOL 300 MG/ML  SOLN
100.0000 mL | Freq: Once | INTRAMUSCULAR | Status: AC | PRN
  Administered 2020-03-31: 100 mL via INTRAVENOUS

## 2020-03-31 MED ORDER — SODIUM CHLORIDE 0.9 % IV BOLUS
1000.0000 mL | Freq: Once | INTRAVENOUS | Status: AC
  Administered 2020-03-31: 1000 mL via INTRAVENOUS

## 2020-03-31 MED ORDER — ONDANSETRON HCL 4 MG/2ML IJ SOLN
4.0000 mg | Freq: Once | INTRAMUSCULAR | Status: AC
  Administered 2020-03-31: 4 mg via INTRAVENOUS

## 2020-03-31 NOTE — ED Notes (Signed)
Pt returned from CT °
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
## 2020-03-31 NOTE — Discharge Instructions (Signed)
We discussed the results of your CT scans at length.  I have prescribed a short course of muscle relaxers along with anti-inflammatories to help with your pain.  Please be aware this medication can make you drowsy, do not drink alcohol or drive while taking this medication.  If you experience any worsening pain around your chest, abdomen, pelvis please return to the emergency department immediately.

## 2020-03-31 NOTE — ED Notes (Signed)
Patient transported to CT 
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
## 2020-03-31 NOTE — ED Notes (Signed)
Pt reporting nausea actively dry heaving, provider made aware.

## 2020-03-31 NOTE — ED Notes (Signed)
Pt co of facial pain requesting analgesic, provider made aware.

## 2020-04-01 NOTE — ED Notes (Signed)
Pt given a cup of ginger ale and an ice pack.

## 2022-03-11 IMAGING — CR DG KNEE COMPLETE 4+V*L*
4 series · 4 of 4 positions shown · non-contrast
Comparison: None.

CLINICAL DATA: Pain

EXAM:
LEFT KNEE - COMPLETE 4+ VIEW

[knee ap]
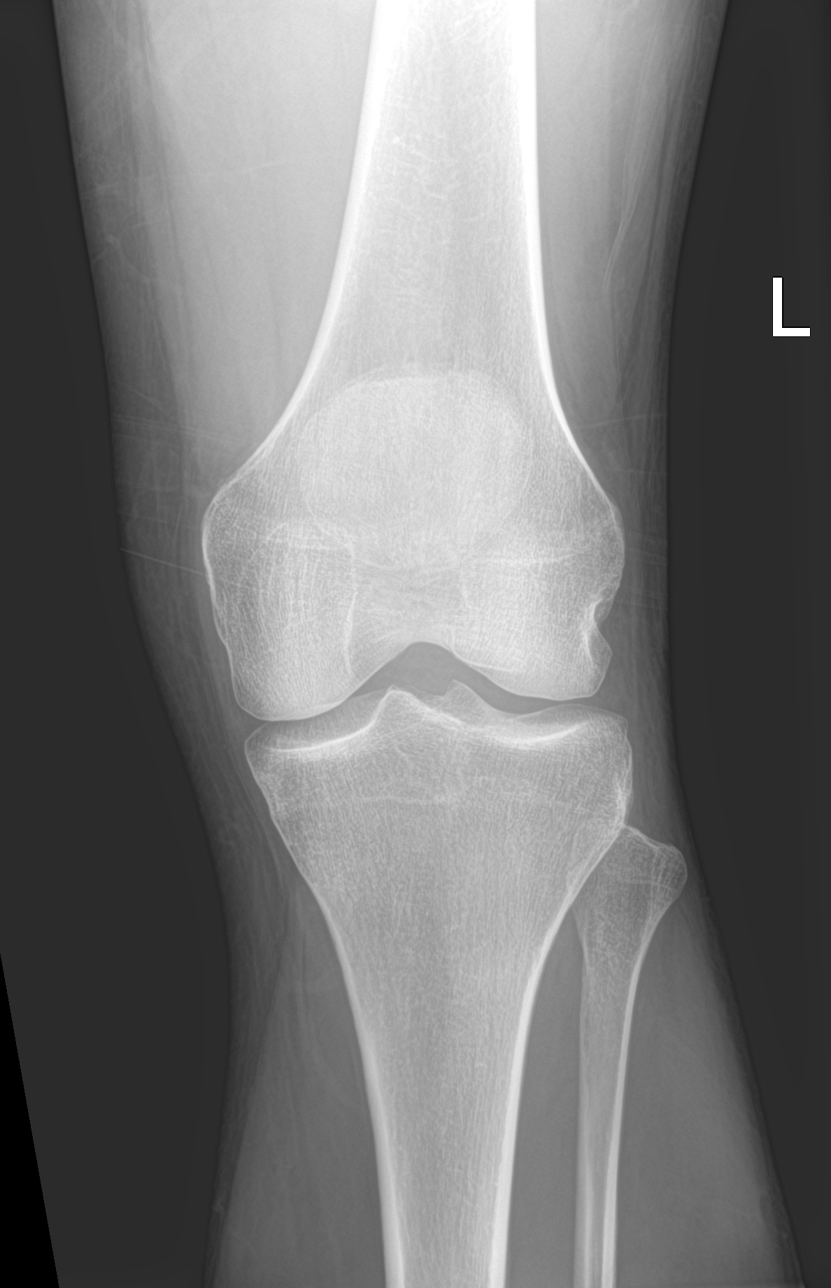

[knee lat]
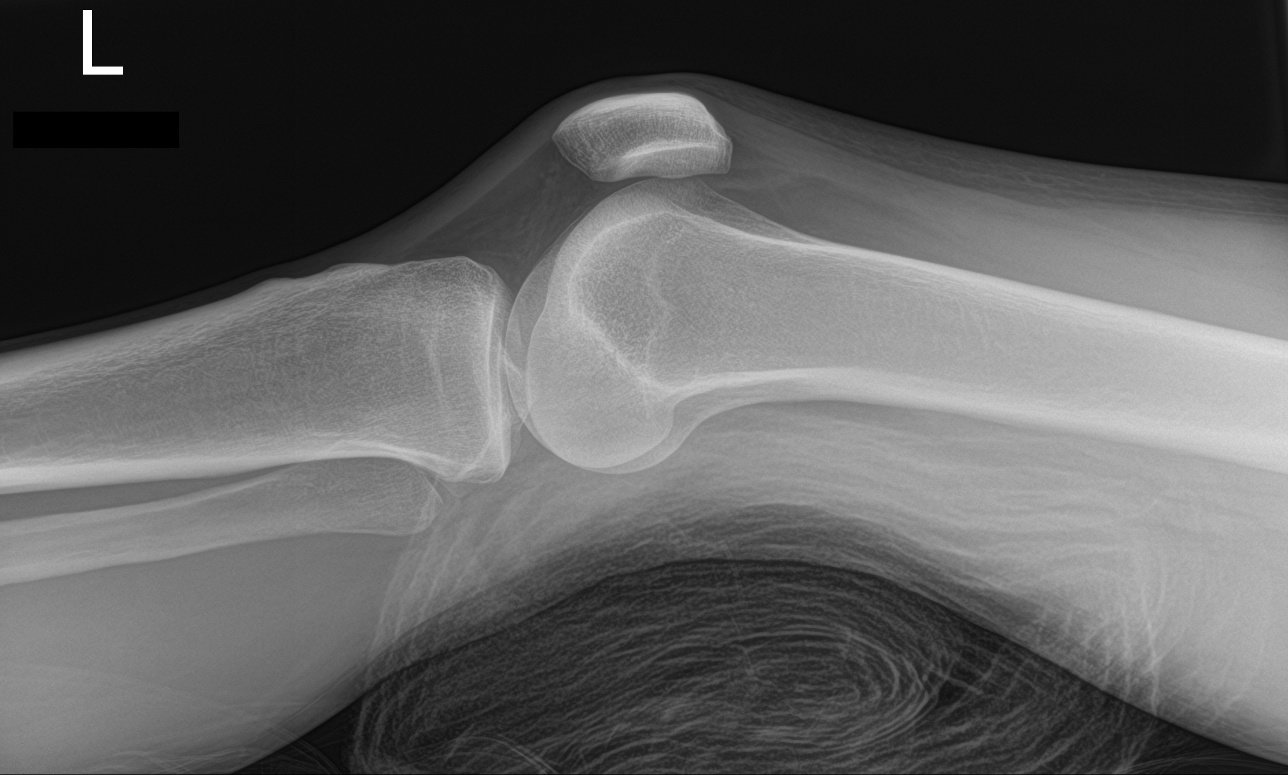

[knee obl (1 of 2)]
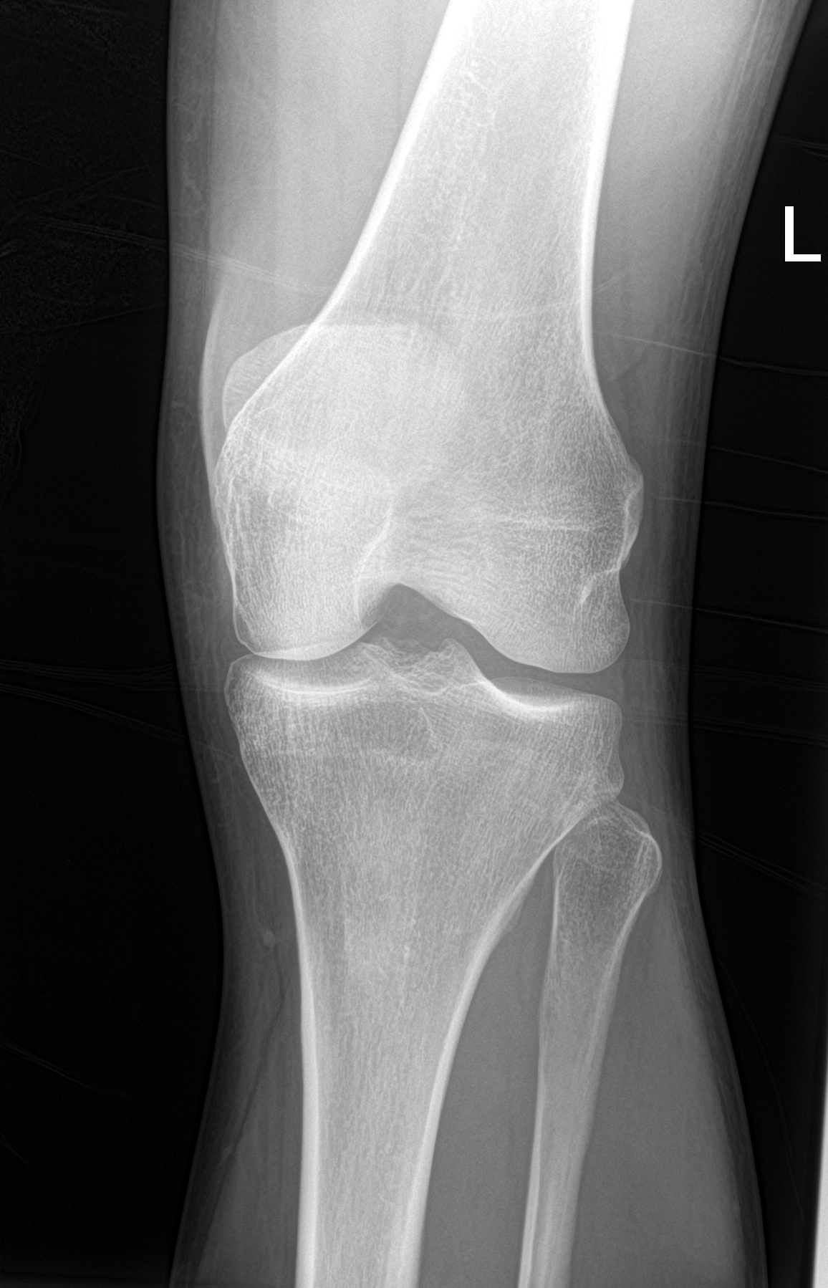

[knee obl (2 of 2)]
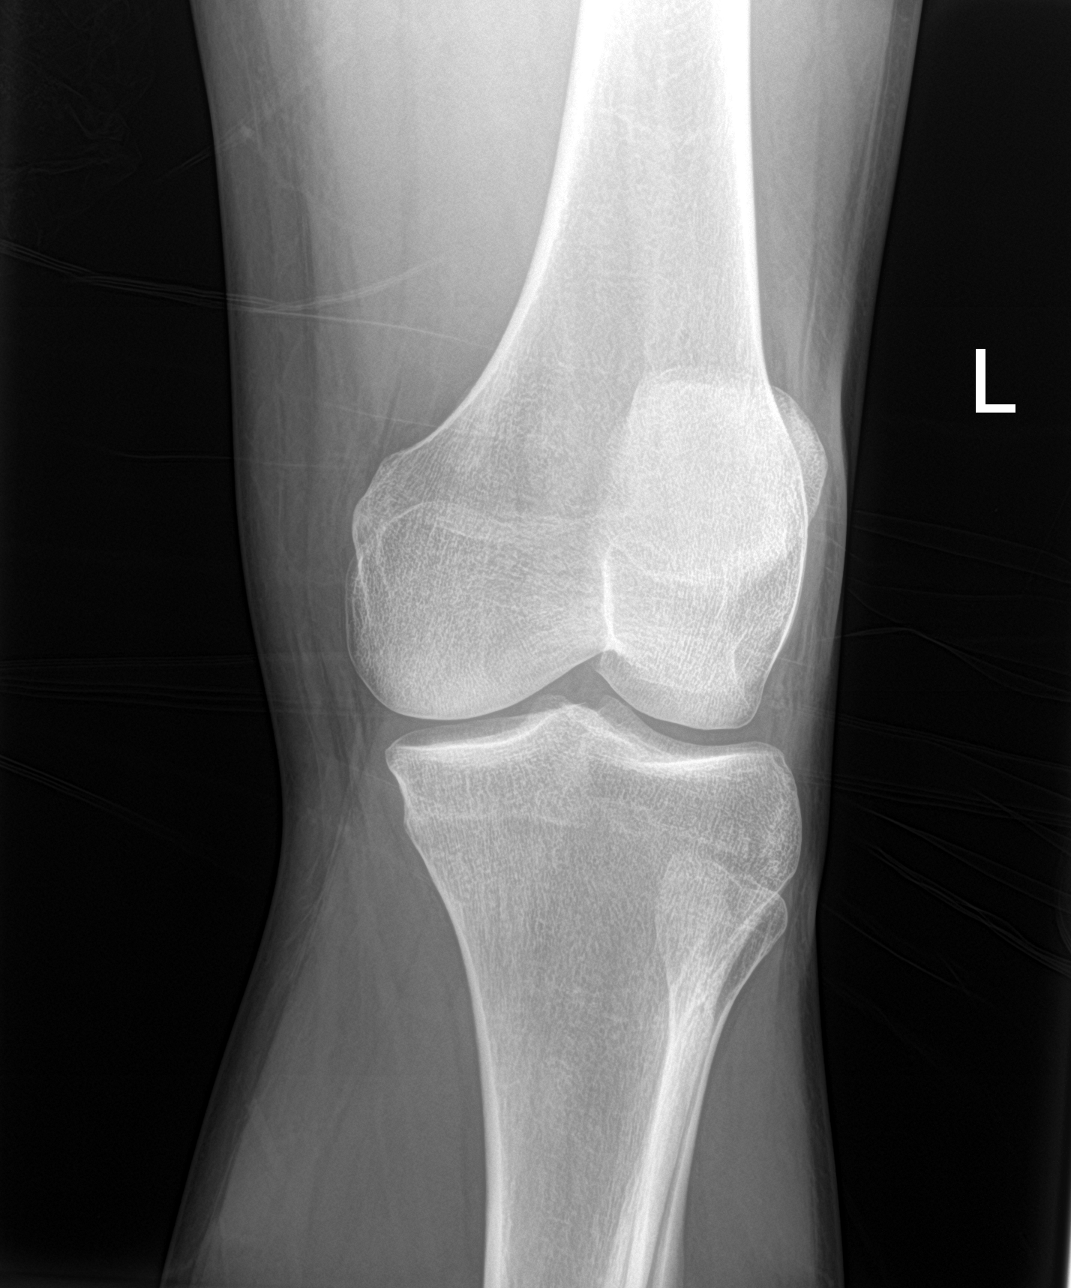

[4 of 4 positions shown; findings below may reference images not displayed]

FINDINGS: No evidence of fracture, dislocation, or joint effusion. No evidence
of arthropathy or other focal bone abnormality. Soft tissues are
unremarkable.
IMPRESSION: Negative.
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
